# Patient Record
Sex: Female | Born: 1945 | Race: White | Hispanic: No | Marital: Married | Smoking: Former smoker
Health system: Southern US, Community
[De-identification: ages and names within clinical notes are randomized; demographics above are authoritative.]

## PROBLEM LIST (undated history)

## (undated) DIAGNOSIS — K219 Gastro-esophageal reflux disease without esophagitis: Secondary | ICD-10-CM

## (undated) DIAGNOSIS — J189 Pneumonia, unspecified organism: Secondary | ICD-10-CM

## (undated) DIAGNOSIS — L900 Lichen sclerosus et atrophicus: Secondary | ICD-10-CM

## (undated) DIAGNOSIS — F32A Depression, unspecified: Secondary | ICD-10-CM

## (undated) DIAGNOSIS — L409 Psoriasis, unspecified: Secondary | ICD-10-CM

## (undated) DIAGNOSIS — I10 Essential (primary) hypertension: Secondary | ICD-10-CM

## (undated) DIAGNOSIS — D649 Anemia, unspecified: Secondary | ICD-10-CM

## (undated) DIAGNOSIS — J45909 Unspecified asthma, uncomplicated: Secondary | ICD-10-CM

## (undated) DIAGNOSIS — J302 Other seasonal allergic rhinitis: Secondary | ICD-10-CM

## (undated) DIAGNOSIS — M8580 Other specified disorders of bone density and structure, unspecified site: Secondary | ICD-10-CM

## (undated) DIAGNOSIS — F329 Major depressive disorder, single episode, unspecified: Secondary | ICD-10-CM

## (undated) DIAGNOSIS — Z98811 Dental restoration status: Secondary | ICD-10-CM

## (undated) DIAGNOSIS — G43909 Migraine, unspecified, not intractable, without status migrainosus: Secondary | ICD-10-CM

## (undated) DIAGNOSIS — J42 Unspecified chronic bronchitis: Secondary | ICD-10-CM

## (undated) DIAGNOSIS — R2689 Other abnormalities of gait and mobility: Secondary | ICD-10-CM

## (undated) DIAGNOSIS — S82851A Displaced trimalleolar fracture of right lower leg, initial encounter for closed fracture: Secondary | ICD-10-CM

## (undated) DIAGNOSIS — M1990 Unspecified osteoarthritis, unspecified site: Secondary | ICD-10-CM

## (undated) HISTORY — DX: Psoriasis, unspecified: L40.9

## (undated) HISTORY — DX: Other specified disorders of bone density and structure, unspecified site: M85.80

## (undated) HISTORY — DX: Lichen sclerosus et atrophicus: L90.0

## (undated) HISTORY — PX: KNEE SURGERY: SHX244

## (undated) HISTORY — DX: Migraine, unspecified, not intractable, without status migrainosus: G43.909

## (undated) HISTORY — DX: Other abnormalities of gait and mobility: R26.89

## (undated) HISTORY — PX: HAND SURGERY: SHX662

## (undated) HISTORY — PX: TONSILLECTOMY: SUR1361

## (undated) HISTORY — PX: ABDOMINAL HYSTERECTOMY: SHX81

---

## 2000-09-05 ENCOUNTER — Encounter (INDEPENDENT_AMBULATORY_CARE_PROVIDER_SITE_OTHER): Payer: Self-pay | Admitting: Specialist

## 2000-09-05 ENCOUNTER — Ambulatory Visit (HOSPITAL_COMMUNITY): Admission: RE | Admit: 2000-09-05 | Discharge: 2000-09-05 | Payer: Self-pay | Admitting: Gastroenterology

## 2007-12-11 ENCOUNTER — Ambulatory Visit: Payer: Self-pay | Admitting: Gynecology

## 2008-01-19 ENCOUNTER — Ambulatory Visit: Payer: Self-pay | Admitting: Gynecology

## 2008-02-09 ENCOUNTER — Emergency Department (HOSPITAL_COMMUNITY): Admission: EM | Admit: 2008-02-09 | Discharge: 2008-02-10 | Payer: Self-pay | Admitting: Emergency Medicine

## 2008-04-27 ENCOUNTER — Encounter: Admission: RE | Admit: 2008-04-27 | Discharge: 2008-06-07 | Payer: Self-pay | Admitting: Family Medicine

## 2008-04-27 ENCOUNTER — Encounter: Admission: RE | Admit: 2008-04-27 | Discharge: 2008-04-27 | Payer: Self-pay | Admitting: Family Medicine

## 2009-02-09 ENCOUNTER — Ambulatory Visit: Payer: Self-pay | Admitting: Gynecology

## 2009-03-09 ENCOUNTER — Ambulatory Visit: Payer: Self-pay | Admitting: Gynecology

## 2009-11-16 ENCOUNTER — Ambulatory Visit (HOSPITAL_COMMUNITY): Admission: RE | Admit: 2009-11-16 | Discharge: 2009-11-16 | Payer: Self-pay | Admitting: Internal Medicine

## 2010-01-01 ENCOUNTER — Encounter (HOSPITAL_COMMUNITY): Admission: RE | Admit: 2010-01-01 | Discharge: 2010-03-01 | Payer: Self-pay | Admitting: Family Medicine

## 2010-08-17 NOTE — Procedures (Signed)
[facility]  Patient:    Kim Ferrell, Kim Ferrell                           MRN: [id number] Proc. Date: 09/05/00 Adm. Date:  [id number] Attending:  Nelda Marseille CC:         Carola J. Gerri Spore, M.D.   Procedure Report  PROCEDURE:  Colonoscopy with biopsy.  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 Consent was signed after risks, benefits, methods, and options were thoroughly discussed in the office.  MEDICINES USED:  Demerol 50, Versed 5.  DESCRIPTION OF PROCEDURE:  Rectal inspection was pertinent for external hemorrhoids, digital exam was negative. The pediatric video colonoscope was inserted and with mild difficulty due to a tortuous colon, I was able to advance to the cecum. This did require rolling her on her back and some abdominal pressure. No obvious abnormalities seen on insertion. The cecum was identified by the appendiceal orifice and the ileocecal valve. In fact, the scope was inserted a short ways into the terminal ileum which was normal. Photo documentation was obtained. On slow withdrawal through the colon, the prep was adequate. There was some liquid stool that required washing and suctioning but on slow withdrawal through the colon, no abnormalities were seen. When we fell back around the tortuous loop, we did try to readvance around the curves. Once back in the rectum, the scope was retroflexed pertinent for some internal hemorrhoids and possibly some tiny hyperplastic appearing polyps, three of which were cold biopsied but not all. They had no worrisome characteristics. The scope was straightened, air was withdrawn and the scope removed. The patient tolerated the procedure well and there was no obvious or immediate complication.  ENDOSCOPIC DIAGNOSIS: 1. Internal external hemorrhoids. 2. Tortuous colon particularly in the sigmoid. 3. Tiny  rectal hyperplastic appearing polyps on retroflexion status post cold    biopsy of a few of them. 4. Otherwise within normal limits to the terminal ileum.  PLAN:  Happy to see back p.r.n. Will await pathology but probably recheck colon screening in five years. Yearly rectals and guaiacs per Dr. Gerri Spore. DD:  09/05/00 TD:  09/05/00 Job: [id number]

## 2010-12-31 ENCOUNTER — Encounter: Payer: Self-pay | Admitting: *Deleted

## 2010-12-31 ENCOUNTER — Encounter: Payer: Self-pay | Admitting: Gynecology

## 2010-12-31 ENCOUNTER — Ambulatory Visit (INDEPENDENT_AMBULATORY_CARE_PROVIDER_SITE_OTHER): Payer: Medicare Other | Admitting: Gynecology

## 2010-12-31 DIAGNOSIS — N898 Other specified noninflammatory disorders of vagina: Secondary | ICD-10-CM

## 2010-12-31 DIAGNOSIS — B373 Candidiasis of vulva and vagina: Secondary | ICD-10-CM

## 2010-12-31 DIAGNOSIS — L409 Psoriasis, unspecified: Secondary | ICD-10-CM | POA: Insufficient documentation

## 2010-12-31 DIAGNOSIS — N952 Postmenopausal atrophic vaginitis: Secondary | ICD-10-CM

## 2010-12-31 DIAGNOSIS — L940 Localized scleroderma [morphea]: Secondary | ICD-10-CM

## 2010-12-31 DIAGNOSIS — G43909 Migraine, unspecified, not intractable, without status migrainosus: Secondary | ICD-10-CM | POA: Insufficient documentation

## 2010-12-31 DIAGNOSIS — B3731 Acute candidiasis of vulva and vagina: Secondary | ICD-10-CM

## 2010-12-31 DIAGNOSIS — IMO0002 Reserved for concepts with insufficient information to code with codable children: Secondary | ICD-10-CM

## 2010-12-31 DIAGNOSIS — L900 Lichen sclerosus et atrophicus: Secondary | ICD-10-CM | POA: Insufficient documentation

## 2010-12-31 MED ORDER — ESTRADIOL 0.1 MG/GM VA CREA: 2.0000 g | TOPICAL_CREAM | Freq: Every day | VAGINAL | Status: DC

## 2010-12-31 MED ORDER — CLOBETASOL PROP EMOLLIENT BASE 0.05 % EX CREA: TOPICAL_CREAM | CUTANEOUS | Status: DC

## 2010-12-31 MED ORDER — FLUCONAZOLE 150 MG PO TABS: 150.0000 mg | ORAL_TABLET | Freq: Once | ORAL | Status: AC

## 2010-12-31 NOTE — Progress Notes (Signed)
The patient presents complaining of vaginal irritation and will and this. The ligament intercourse due to discomfort. She has a history of lichen sclerosus has not been seen the office for 2 years. Had been treated with Temovate and Estrace vaginal cream but stopped these when she ran out.  Exam External with superficial excoriations around the posterior fourchette consistent with atrophic changes as well as whitefish skin changes symmetrical on the labia bilaterally. Speculum shows vagina atrophic with white discharge KOH wet prep done digital bimanual without masses or tenderness.  Assessment and plan: Lichen sclerosus with significant atrophic changes. We'll start with Temovate 0.05% cream nightly and Estrace 1-2 g vaginally nightly. I again discussed the issues of absorption with increased risk of stroke heart attack DVT which we had previously discussed I again discussed that now she understands and accepts this.  KOH wet prep does show yeast we'll treat with Diflucan 150x1 dose. I asked her to make appointment to see me in a month regardless and then we'll go from there.  She does have an appointment to see an internist for general health maintenance and does her general physical exams to include breast exams and she is in the process of scheduling a mammogram now. I discussed Pap smear recommendations. She is status post hysterectomy has never had an issue with abnormal Pap smears historically I think given this history with her age we can stop doing Pap smears now. Patient agrees with this.

## 2011-01-01 LAB — CBC
HCT: 35.9 — ABNORMAL LOW
MCHC: 34.1

## 2011-01-01 LAB — DIFFERENTIAL
Basophils Absolute: 0
Eosinophils Absolute: 0.2
Eosinophils Relative: 4
Lymphocytes Relative: 41
Neutrophils Relative %: 47

## 2011-01-01 LAB — URINALYSIS, ROUTINE W REFLEX MICROSCOPIC
Glucose, UA: NEGATIVE
Hgb urine dipstick: NEGATIVE
Ketones, ur: NEGATIVE
Nitrite: NEGATIVE
Specific Gravity, Urine: 1.014
Urobilinogen, UA: 0.2
pH: 6

## 2011-01-01 LAB — COMPREHENSIVE METABOLIC PANEL
ALT: 13
AST: 15
BUN: 15
CO2: 31
GFR calc non Af Amer: >60
Sodium: 140
Total Bilirubin: 0.6

## 2011-01-01 LAB — RAPID URINE DRUG SCREEN, HOSP PERFORMED
Benzodiazepines: NOT DETECTED
Cocaine: NOT DETECTED
Opiates: NOT DETECTED
Tetrahydrocannabinol: NOT DETECTED

## 2011-01-01 LAB — URINE MICROSCOPIC-ADD ON

## 2011-01-01 LAB — LIPASE, BLOOD: Lipase: 20

## 2011-01-01 LAB — ETHANOL: Alcohol, Ethyl (B): <5

## 2011-02-04 ENCOUNTER — Encounter: Payer: Self-pay | Admitting: Gynecology

## 2011-02-04 ENCOUNTER — Ambulatory Visit (INDEPENDENT_AMBULATORY_CARE_PROVIDER_SITE_OTHER): Payer: Medicare Other | Admitting: Gynecology

## 2011-02-04 DIAGNOSIS — N952 Postmenopausal atrophic vaginitis: Secondary | ICD-10-CM

## 2011-02-04 DIAGNOSIS — L900 Lichen sclerosus et atrophicus: Secondary | ICD-10-CM

## 2011-02-04 DIAGNOSIS — L940 Localized scleroderma [morphea]: Secondary | ICD-10-CM

## 2011-02-04 NOTE — Progress Notes (Signed)
Patient presents in follow up having been seen in the beginning of October with lichen sclerosis and atrophic vaginal changes. She started on Temovate 0.05% cream nightly and Estrace 1-2 g vaginally nightly. She has not used it for 2 weeks now [place] and did not want to deal with it. Notes significant improvement in her symptoms.  Exam Pelvic external with symmetrical whitish changes consistent with lichen sclerosus and atrophic changes. No cracking or excoriations previously noted.  Assessment and plan: Lichen sclerosus and atrophic genital changes. I recommended to continue both creams for another month and then wean herself off the Temovate. Continue at a baseline twice weekly use of the Estrace cream. I again reviewed with her the risks of absorption of the Estrace and the risk of stroke heart attack DVT as well as possible breast cancer issues. She is status post hysterectomy. Again discussed Vagifem alternative at this point we'll stay with the Estrace cream as she's comfortable with this. She'll see me in 6 months and follow up assuming she continues doing well. She has a flare of her lichen sclerosus and she'll use the Temovate for 2-3 weeks nightly and then wean herself back off again.

## 2011-02-04 NOTE — Patient Instructions (Signed)
Follow up appt in 6 months

## 2011-02-08 ENCOUNTER — Encounter: Payer: Self-pay | Admitting: Gynecology

## 2011-05-24 ENCOUNTER — Ambulatory Visit
Admission: RE | Admit: 2011-05-24 | Discharge: 2011-05-24 | Disposition: A | Discharge status: Home / Self Care | Payer: BC Managed Care – PPO | Source: Ambulatory Visit | Attending: Internal Medicine | Admitting: Internal Medicine

## 2011-05-24 ENCOUNTER — Other Ambulatory Visit: Payer: Self-pay | Admitting: Internal Medicine

## 2011-05-24 DIAGNOSIS — R51 Headache: Secondary | ICD-10-CM

## 2011-05-29 ENCOUNTER — Emergency Department (HOSPITAL_BASED_OUTPATIENT_CLINIC_OR_DEPARTMENT_OTHER)
Admission: EM | Admit: 2011-05-29 | Discharge: 2011-05-29 | Disposition: A | Discharge status: Home / Self Care | Payer: Medicare Other | Attending: Emergency Medicine | Admitting: Emergency Medicine

## 2011-05-29 ENCOUNTER — Encounter (HOSPITAL_BASED_OUTPATIENT_CLINIC_OR_DEPARTMENT_OTHER): Payer: Self-pay | Admitting: *Deleted

## 2011-05-29 ENCOUNTER — Other Ambulatory Visit: Payer: Self-pay

## 2011-05-29 DIAGNOSIS — I10 Essential (primary) hypertension: Secondary | ICD-10-CM | POA: Insufficient documentation

## 2011-05-29 DIAGNOSIS — F3289 Other specified depressive episodes: Secondary | ICD-10-CM | POA: Insufficient documentation

## 2011-05-29 DIAGNOSIS — F329 Major depressive disorder, single episode, unspecified: Secondary | ICD-10-CM | POA: Insufficient documentation

## 2011-05-29 DIAGNOSIS — R6883 Chills (without fever): Secondary | ICD-10-CM | POA: Insufficient documentation

## 2011-05-29 DIAGNOSIS — Z87891 Personal history of nicotine dependence: Secondary | ICD-10-CM | POA: Insufficient documentation

## 2011-05-29 DIAGNOSIS — R1110 Vomiting, unspecified: Secondary | ICD-10-CM | POA: Insufficient documentation

## 2011-05-29 DIAGNOSIS — L940 Localized scleroderma [morphea]: Secondary | ICD-10-CM | POA: Insufficient documentation

## 2011-05-29 DIAGNOSIS — R197 Diarrhea, unspecified: Secondary | ICD-10-CM

## 2011-05-29 DIAGNOSIS — E876 Hypokalemia: Secondary | ICD-10-CM

## 2011-05-29 DIAGNOSIS — K219 Gastro-esophageal reflux disease without esophagitis: Secondary | ICD-10-CM | POA: Insufficient documentation

## 2011-05-29 DIAGNOSIS — L408 Other psoriasis: Secondary | ICD-10-CM | POA: Insufficient documentation

## 2011-05-29 DIAGNOSIS — Z9079 Acquired absence of other genital organ(s): Secondary | ICD-10-CM | POA: Insufficient documentation

## 2011-05-29 DIAGNOSIS — G43909 Migraine, unspecified, not intractable, without status migrainosus: Secondary | ICD-10-CM | POA: Insufficient documentation

## 2011-05-29 HISTORY — DX: Essential (primary) hypertension: I10

## 2011-05-29 HISTORY — DX: Gastro-esophageal reflux disease without esophagitis: K21.9

## 2011-05-29 HISTORY — DX: Depression, unspecified: F32.A

## 2011-05-29 HISTORY — DX: Major depressive disorder, single episode, unspecified: F32.9

## 2011-05-29 LAB — COMPREHENSIVE METABOLIC PANEL
ALT: 13 U/L (ref 0–35)
AST: 17 U/L (ref 0–37)
Albumin: 4.1 g/dL (ref 3.5–5.2)
Calcium: 9.6 mg/dL (ref 8.4–10.5)
Creatinine, Ser: 0.8 mg/dL (ref 0.50–1.10)
GFR calc non Af Amer: 75 mL/min — ABNORMAL LOW (ref >90)
Sodium: 136 mEq/L (ref 135–145)
Total Protein: 7 g/dL (ref 6.0–8.3)

## 2011-05-29 LAB — DIFFERENTIAL
Basophils Absolute: 0 10*3/uL (ref 0.0–0.1)
Basophils Relative: 0 % (ref 0–1)
Eosinophils Absolute: 0.2 10*3/uL (ref 0.0–0.7)
Eosinophils Relative: 2 % (ref 0–5)
Monocytes Absolute: 0.6 10*3/uL (ref 0.1–1.0)

## 2011-05-29 LAB — CBC
HCT: 36.7 % (ref 36.0–46.0)
MCHC: 35.1 g/dL (ref 30.0–36.0)
MCV: 85.7 fL (ref 78.0–100.0)
Platelets: 292 10*3/uL (ref 150–400)
RDW: 13.1 % (ref 11.5–15.5)
WBC: 7.9 10*3/uL (ref 4.0–10.5)

## 2011-05-29 MED ORDER — ONDANSETRON HCL 4 MG/2ML IJ SOLN
INTRAMUSCULAR | Status: AC
  Administered 2011-05-29: 4 mg via INTRAVENOUS
  Filled 2011-05-29: qty 2

## 2011-05-29 MED ORDER — ONDANSETRON HCL 4 MG/2ML IJ SOLN
4.0000 mg | Freq: Once | INTRAMUSCULAR | Status: AC
  Administered 2011-05-29: 4 mg via INTRAVENOUS

## 2011-05-29 MED ORDER — SODIUM CHLORIDE 0.9 % IV BOLUS (SEPSIS)
1000.0000 mL | Freq: Once | INTRAVENOUS | Status: AC
  Administered 2011-05-29: 1000 mL via INTRAVENOUS

## 2011-05-29 MED ORDER — ONDANSETRON HCL 4 MG/2ML IJ SOLN
4.0000 mg | Freq: Once | INTRAMUSCULAR | Status: AC
  Administered 2011-05-29: 4 mg via INTRAVENOUS
  Filled 2011-05-29: qty 2

## 2011-05-29 MED ORDER — ONDANSETRON 8 MG PO TBDP: 8.0000 mg | ORAL_TABLET | Freq: Three times a day (TID) | ORAL | Status: AC | PRN

## 2011-05-29 MED ORDER — POTASSIUM CHLORIDE 20 MEQ/15ML (10%) PO LIQD
10.0000 meq | Freq: Once | ORAL | Status: AC
  Administered 2011-05-29: 10 meq via ORAL
  Filled 2011-05-29: qty 15

## 2011-05-29 NOTE — Discharge Instructions (Signed)
Diarrhea Infections caused by germs (bacterial) or a virus commonly cause diarrhea. Your caregiver has determined that with time, rest and fluids, the diarrhea should improve. In general, eat normally while drinking more water than usual. Although water may prevent dehydration, it does not contain salt and minerals (electrolytes). Broths, weak tea without caffeine and oral rehydration solutions (ORS) replace fluids and electrolytes. Small amounts of fluids should be taken frequently. Large amounts at one time may not be tolerated. Plain water may be harmful in infants and the elderly. Oral rehydrating solutions (ORS) are available at pharmacies and grocery stores. ORS replace water and important electrolytes in proper proportions. Sports drinks are not as effective as ORS and may be harmful due to sugars worsening diarrhea.  ORS is especially recommended for use in children with diarrhea. As a general guideline for children, replace any new fluid losses from diarrhea and/or vomiting with ORS as follows:   If your child weighs 22 pounds or under (10 kg or less), give 60-120 mL ( -  cup or 2 - 4 ounces) of ORS for each episode of diarrheal stool or vomiting episode.   If your child weighs more than 22 pounds (more than 10 kgs), give 120-240 mL ( - 1 cup or 4 - 8 ounces) of ORS for each diarrheal stool or episode of vomiting.   While correcting for dehydration, children should eat normally. However, foods high in sugar should be avoided because this may worsen diarrhea. Large amounts of carbonated soft drinks, juice, gelatin desserts and other highly sugared drinks should be avoided.   After correction of dehydration, other liquids that are appealing to the child may be added. Children should drink small amounts of fluids frequently and fluids should be increased as tolerated. Children should drink enough fluids to keep urine clear or pale yellow.   Adults should eat normally while drinking more fluids  than usual. Drink small amounts of fluids frequently and increase as tolerated. Drink enough fluids to keep urine clear or pale yellow. Broths, weak decaffeinated tea, lemon lime soft drinks (allowed to go flat) and ORS replace fluids and electrolytes.   Avoid:   Carbonated drinks.   Juice.   Extremely hot or cold fluids.   Caffeine drinks.   Fatty, greasy foods.   Alcohol.   Tobacco.   Too much intake of anything at one time.   Gelatin desserts.   Probiotics are active cultures of beneficial bacteria. They may lessen the amount and number of diarrheal stools in adults. Probiotics can be found in yogurt with active cultures and in supplements.   Wash hands well to avoid spreading bacteria and virus.   Anti-diarrheal medications are not recommended for infants and children.   Only take over-the-counter or prescription medicines for pain, discomfort or fever as directed by your caregiver. Do not give aspirin to children because it may cause Reye's Syndrome.   For adults, ask your caregiver if you should continue all prescribed and over-the-counter medicines.   If your caregiver has given you a follow-up appointment, it is very important to keep that appointment. Not keeping the appointment could result in a chronic or permanent injury, and disability. If there is any problem keeping the appointment, you must call back to this facility for assistance.  SEEK IMMEDIATE MEDICAL CARE IF:   You or your child is unable to keep fluids down or other symptoms or problems become worse in spite of treatment.   Vomiting or diarrhea develops and becomes persistent.     There is vomiting of blood or bile (green material).   There is blood in the stool or the stools are black and tarry.   There is no urine output in 6-8 hours or there is only a small amount of very dark urine.   Abdominal pain develops, increases or localizes.   You have a fever.   Your baby is older than 3 months with a  rectal temperature of 102 F (38.9 C) or higher.   Your baby is 3 months old or younger with a rectal temperature of 100.4 F (38 C) or higher.   You or your child develops excessive weakness, dizziness, fainting or extreme thirst.   You or your child develops a rash, stiff neck, severe headache or become irritable or sleepy and difficult to awaken.  MAKE SURE YOU:   Understand these instructions.   Will watch your condition.   Will get help right away if you are not doing well or get worse.  Document Released: 03/08/2002 Document Revised: 11/28/2010 Document Reviewed: 01/23/2009 [employer] Patient Information 2012 [employer]. 

## 2011-05-29 NOTE — ED Notes (Signed)
Pt ambulatory to restroom

## 2011-05-29 NOTE — ED Notes (Signed)
Pt given ice chips. Will reassess shortly

## 2011-05-29 NOTE — ED Provider Notes (Signed)
History     CSN: [id number]  Arrival date & time 05/29/11  2107   First MD Initiated Contact with Patient 05/29/11 2116      Chief Complaint  Patient presents with  . Emesis     Patient is a 66 y.o. female presenting with vomiting. The history is provided by the patient and the spouse.  Emesis  This is a new problem. The current episode started less than 1 hour ago. The problem occurs 5 to 10 times per day. The problem has been gradually worsening. The emesis has an appearance of stomach contents. There has been no fever. Associated symptoms include chills and diarrhea. Pertinent negatives include no abdominal pain. Risk factors include suspect food intake.  patient was at a restaurant when she had abrupt onset of nausea/vomiting/diarrhea No blood noted in stool/vomitus No abd pain No syncope No cp/sob She had otherwise been well prior to dinner No recent travel  Past Medical History  Diagnosis Date  . Lichen sclerosus   . Psoriasis   . Migraines   . Hypertension   . Depression   . GERD (gastroesophageal reflux disease)     Past Surgical History  Procedure Date  . Left hand surgery   . Abdominal hysterectomy     Family History  Problem Relation Age of Onset  . Diabetes Mother   . Heart disease Mother   . Cancer Father     skin    History  Substance Use Topics  . Smoking status: Former Games developer  . Smokeless tobacco: Never Used  . Alcohol Use: 1.0 oz/week    2 drink(s) per week     pt drank wine tonight    OB History    Grav Para Term Preterm Abortions TAB SAB Ect Mult Living   2 2 2       2       Review of Systems  Constitutional: Positive for chills.  Gastrointestinal: Positive for vomiting and diarrhea. Negative for abdominal pain.  All other systems reviewed and are negative.    Allergies  Neosporin  Home Medications   Current Outpatient Rx  Name Route Sig Dispense Refill  . BUPROPION HCL ER (SR) 150 MG PO TB12 Oral Take 150 mg by mouth  daily.    Marland Kitchen BUTALBITAL-APAP-CAFF-COD 50-325-40-30 MG PO CAPS Oral Take 1 capsule by mouth every 4 (four) hours as needed. For headache    . CALCIPOTRIENE 0.005 % EX CREA Topical Apply 1 application topically 2 (two) times daily.    Marland Kitchen FEXOFENADINE HCL 180 MG PO TABS Oral Take 180 mg by mouth daily.    Marland Kitchen HALOBETASOL PROPIONATE 0.05 % EX CREA Topical Apply 1 application topically 2 (two) times daily.    Marland Kitchen LOSARTAN POTASSIUM-HCTZ 100-25 MG PO TABS Oral Take 1 tablet by mouth daily.    Marland Kitchen METHOTREXATE 2.5 MG PO TABS Oral Take 10 mg by mouth once a week. Caution:Chemotherapy. Protect from light.    . OMEPRAZOLE 40 MG PO CPDR Oral Take 40 mg by mouth daily.    Marland Kitchen PAROXETINE HCL 20 MG PO TABS Oral Take 20 mg by mouth every morning.    . SUMATRIPTAN SUCCINATE 100 MG PO TABS Oral Take 100 mg by mouth every 2 (two) hours as needed. For headache      BP 142/82  Pulse 77  Temp(Src) 97.5 F (36.4 C) (Oral)  Resp 18  Ht 5\' 5"  (1.651 m)  Wt 153 lb (69.4 kg)  BMI 25.46 kg/m2  SpO2  99%  LMP 04/02/1987  Physical Exam CONSTITUTIONAL: Well developed/well nourished HEAD AND FACE: Normocephalic/atraumatic EYES: EOMI/PERRL, no scleral icterus ENMT: Mucous membranes dry NECK: supple no meningeal signs SPINE:entire spine nontender CV: S1/S2 noted, no murmurs/rubs/gallops noted LUNGS: Lungs are clear to auscultation bilaterally, no apparent distress ABDOMEN: soft, nontender, no rebound or guarding GU:no cva tenderness NEURO: Pt is awake/alert, moves all extremitiesx4 EXTREMITIES: pulses normal, full ROM SKIN: warm, color normal PSYCH: no abnormalities of mood noted  ED Course  Procedures   Labs Reviewed  COMPREHENSIVE METABOLIC PANEL - Abnormal; Notable for the following:    Potassium 2.9 (*)    Chloride 95 (*)    Glucose, Bld 118 (*)    GFR calc non Af Amer 75 (*)    GFR calc Af Amer 87 (*)    All other components within normal limits  CBC  DIFFERENTIAL  LIPASE, BLOOD   10:12 PM Pt with  abrupt onset of vomiting/diarrhea now improved Will rehydrate and reassess  11:33 PM Pt improved Taking PO BP 118/70  Pulse 79  Temp(Src) 97.5 F (36.4 C) (Oral)  Resp 16  Ht 5\' 5"  (1.651 m)  Wt 153 lb (69.4 kg)  BMI 25.46 kg/m2  SpO2 95%  LMP 04/02/1987 Stable for d/c Suspect gastroenteritis   MDM  Nursing notes reviewed and considered in documentation All labs/vitals reviewed and considered    Date: 05/29/2011  Rate: 74  Rhythm: normal sinus rhythm  QRS Axis: normal  Intervals: normal  ST/T Wave abnormalities: normal  Conduction Disutrbances:none  Narrative Interpretation:   Old EKG Reviewed: unchanged          Joya Gaskins, MD 05/29/11 2333

## 2011-05-29 NOTE — ED Notes (Signed)
No vomiting noted after eating ice chips or drinking Potassium. Pt denies nausea and states she feels ready for discharge

## 2011-05-29 NOTE — ED Notes (Signed)
Pt reports nausea is returning. No active vomiting at this time.

## 2011-05-29 NOTE — ED Notes (Signed)
No vomiting noted since meds administered. Warm blankets applied. IVF continue to infuse. Husband left to pick up clean clothes from home.

## 2011-05-29 NOTE — ED Notes (Signed)
Per EMS: pt was eating dinner tonight, when she had a sudden onset of N/V/D. Pt has vomited multiple times and stooled on self. Pt actively vomiting upon arrival to ER.

## 2011-09-16 ENCOUNTER — Emergency Department (HOSPITAL_BASED_OUTPATIENT_CLINIC_OR_DEPARTMENT_OTHER)
Admission: EM | Admit: 2011-09-16 | Discharge: 2011-09-16 | Disposition: A | Discharge status: Home / Self Care | Payer: Medicare Other | Attending: Emergency Medicine | Admitting: Emergency Medicine

## 2011-09-16 ENCOUNTER — Encounter (HOSPITAL_BASED_OUTPATIENT_CLINIC_OR_DEPARTMENT_OTHER): Payer: Self-pay | Admitting: *Deleted

## 2011-09-16 ENCOUNTER — Encounter (HOSPITAL_BASED_OUTPATIENT_CLINIC_OR_DEPARTMENT_OTHER): Payer: Self-pay | Admitting: Family Medicine

## 2011-09-16 ENCOUNTER — Emergency Department (HOSPITAL_BASED_OUTPATIENT_CLINIC_OR_DEPARTMENT_OTHER): Payer: Medicare Other

## 2011-09-16 DIAGNOSIS — W108XXA Fall (on) (from) other stairs and steps, initial encounter: Secondary | ICD-10-CM | POA: Insufficient documentation

## 2011-09-16 DIAGNOSIS — S82851A Displaced trimalleolar fracture of right lower leg, initial encounter for closed fracture: Secondary | ICD-10-CM

## 2011-09-16 DIAGNOSIS — F3289 Other specified depressive episodes: Secondary | ICD-10-CM | POA: Insufficient documentation

## 2011-09-16 DIAGNOSIS — K219 Gastro-esophageal reflux disease without esophagitis: Secondary | ICD-10-CM | POA: Insufficient documentation

## 2011-09-16 DIAGNOSIS — S82853A Displaced trimalleolar fracture of unspecified lower leg, initial encounter for closed fracture: Secondary | ICD-10-CM | POA: Insufficient documentation

## 2011-09-16 DIAGNOSIS — I10 Essential (primary) hypertension: Secondary | ICD-10-CM | POA: Insufficient documentation

## 2011-09-16 DIAGNOSIS — Y92009 Unspecified place in unspecified non-institutional (private) residence as the place of occurrence of the external cause: Secondary | ICD-10-CM | POA: Insufficient documentation

## 2011-09-16 DIAGNOSIS — M542 Cervicalgia: Secondary | ICD-10-CM | POA: Insufficient documentation

## 2011-09-16 DIAGNOSIS — Z79899 Other long term (current) drug therapy: Secondary | ICD-10-CM | POA: Insufficient documentation

## 2011-09-16 DIAGNOSIS — F329 Major depressive disorder, single episode, unspecified: Secondary | ICD-10-CM | POA: Insufficient documentation

## 2011-09-16 HISTORY — DX: Displaced trimalleolar fracture of right lower leg, initial encounter for closed fracture: S82.851A

## 2011-09-16 LAB — CBC
HCT: 36.3 % (ref 36.0–46.0)
Hemoglobin: 12.7 g/dL (ref 12.0–15.0)
MCV: 88.8 fL (ref 78.0–100.0)
Platelets: 269 10*3/uL (ref 150–400)
RBC: 4.09 MIL/uL (ref 3.87–5.11)
WBC: 6 10*3/uL (ref 4.0–10.5)

## 2011-09-16 LAB — DIFFERENTIAL
Eosinophils Relative: 3 % (ref 0–5)
Lymphocytes Relative: 24 % (ref 12–46)
Lymphs Abs: 1.4 10*3/uL (ref 0.7–4.0)
Monocytes Relative: 8 % (ref 3–12)

## 2011-09-16 LAB — BASIC METABOLIC PANEL
BUN: 18 mg/dL (ref 6–23)
CO2: 29 mEq/L (ref 19–32)
Calcium: 9.3 mg/dL (ref 8.4–10.5)
Glucose, Bld: 121 mg/dL — ABNORMAL HIGH (ref 70–99)
Sodium: 138 mEq/L (ref 135–145)

## 2011-09-16 MED ORDER — ONDANSETRON 8 MG PO TBDP
ORAL_TABLET | ORAL | Status: AC
  Administered 2011-09-16: 8 mg
  Filled 2011-09-16: qty 1

## 2011-09-16 MED ORDER — ONDANSETRON HCL 4 MG PO TABS: 4.0000 mg | ORAL_TABLET | Freq: Three times a day (TID) | ORAL | Status: AC | PRN

## 2011-09-16 MED ORDER — ONDANSETRON HCL 4 MG/2ML IJ SOLN
4.0000 mg | Freq: Once | INTRAMUSCULAR | Status: AC
  Administered 2011-09-16: 4 mg via INTRAVENOUS
  Filled 2011-09-16: qty 2

## 2011-09-16 MED ORDER — MORPHINE SULFATE 4 MG/ML IJ SOLN
4.0000 mg | Freq: Once | INTRAMUSCULAR | Status: AC
  Administered 2011-09-16: 4 mg via INTRAVENOUS
  Filled 2011-09-16: qty 1

## 2011-09-16 MED ORDER — ONDANSETRON HCL 4 MG/2ML IJ SOLN
4.0000 mg | Freq: Once | INTRAMUSCULAR | Status: DC
  Filled 2011-09-16: qty 2

## 2011-09-16 MED ORDER — HYDROCODONE-ACETAMINOPHEN 5-325 MG PO TABS: 2.0000 | ORAL_TABLET | ORAL | Status: DC | PRN

## 2011-09-16 MED ORDER — HYDROMORPHONE HCL PF 1 MG/ML IJ SOLN
1.0000 mg | Freq: Once | INTRAMUSCULAR | Status: AC
  Administered 2011-09-16: 1 mg via INTRAVENOUS
  Filled 2011-09-16: qty 1

## 2011-09-16 MED ORDER — HYDROCODONE-ACETAMINOPHEN 5-325 MG PO TABS
2.0000 | ORAL_TABLET | Freq: Once | ORAL | Status: AC
  Administered 2011-09-16: 2 via ORAL
  Filled 2011-09-16: qty 2

## 2011-09-16 NOTE — ED Provider Notes (Signed)
History     CSN: [id number]  Arrival date & time 09/16/11  6213   First MD Initiated Contact with Patient 09/16/11 0914      Chief Complaint  Patient presents with  . Ankle Pain    (Consider location/radiation/quality/duration/timing/severity/associated sxs/prior treatment) HPI Patient is a 66 year old female who sustained a mechanical fall this morning after slipping on her back stairs on wet concrete. Patient fell about 3 feet. She did not strike her head or neck but did describe some neck tenderness with this. Patient's chief complaint is severe right ankle pain that she rates as a 10 out of 10. Patient has obvious right ankle deformity with a slight abrasion overlying this. Patient denies any chest pain, shortness of breath, or abdominal pain. She reports radiation of her pain up her leg. She has very mild tenderness on palpation of the proximal tibia and fibula. Patient complains of some right thigh pain but the right thigh is soft and without deformity. Patient has history of osteopenia from frequent steroid use for her psoriasis. Pain is worse with movement or palpation. There are no other associated or modifying factors. Past Medical History  Diagnosis Date  . Lichen sclerosus   . Psoriasis   . Migraines   . Hypertension   . Depression   . GERD (gastroesophageal reflux disease)     Past Surgical History  Procedure Date  . Left hand surgery   . Abdominal hysterectomy     Family History  Problem Relation Age of Onset  . Diabetes Mother   . Heart disease Mother   . Cancer Father     skin    History  Substance Use Topics  . Smoking status: Former Games developer  . Smokeless tobacco: Never Used  . Alcohol Use: 1.0 oz/week    2 drink(s) per week     pt drank wine tonight    OB History    Grav Para Term Preterm Abortions TAB SAB Ect Mult Living   2 2 2       2       Review of Systems  Constitutional: Negative.   HENT: Positive for neck pain.   Eyes: Negative.     Respiratory: Negative.   Cardiovascular: Negative.   Gastrointestinal: Negative.   Genitourinary: Negative.   Musculoskeletal:       Slight neck stiffness as well severe right ankle pain as described in history of present illness  Skin: Positive for wound.       Small laceration overlying the ankle.  Neurological: Negative.   Hematological: Negative.   Psychiatric/Behavioral: Negative.   All other systems reviewed and are negative.    Allergies  Neosporin  Home Medications   Current Outpatient Rx  Name Route Sig Dispense Refill  . BUPROPION HCL ER (SR) 150 MG PO TB12 Oral Take 150 mg by mouth daily.    Marland Kitchen BUTALBITAL-APAP-CAFF-COD 50-325-40-30 MG PO CAPS Oral Take 1 capsule by mouth every 4 (four) hours as needed. For headache    . CALCIPOTRIENE 0.005 % EX CREA Topical Apply 1 application topically 2 (two) times daily.    Marland Kitchen FEXOFENADINE HCL 180 MG PO TABS Oral Take 180 mg by mouth daily.    Marland Kitchen HALOBETASOL PROPIONATE 0.05 % EX CREA Topical Apply 1 application topically 2 (two) times daily.    Marland Kitchen LOSARTAN POTASSIUM-HCTZ 100-25 MG PO TABS Oral Take 1 tablet by mouth daily.    Marland Kitchen METHOTREXATE 2.5 MG PO TABS Oral Take 10 mg by mouth once a  week. Caution:Chemotherapy. Protect from light.    . OMEPRAZOLE 40 MG PO CPDR Oral Take 40 mg by mouth daily.    Marland Kitchen PAROXETINE HCL 20 MG PO TABS Oral Take 20 mg by mouth every morning.    . SUMATRIPTAN SUCCINATE 100 MG PO TABS Oral Take 100 mg by mouth every 2 (two) hours as needed. For headache      BP 123/75  Pulse 81  Temp 97.8 F (36.6 C) (Oral)  Resp 20  Ht 5\' 5"  (1.651 m)  Wt 154 lb (69.854 kg)  BMI 25.63 kg/m2  SpO2 96%  LMP 04/02/1987  Physical Exam  Nursing note and vitals reviewed. GEN: Well-developed, well-nourished female in mild distress HEENT: Atraumatic, normocephalic. Oropharynx clear without erythema EYES: PERRLA BL, no scleral icterus. NECK: Trachea midline, no meningismus. Mild tenderness to palpation over the trapezius  muscles bilaterally. No midline tenderness or step-off appreciated. CV: regular rate and rhythm. No murmurs, rubs, or gallops PULM: No respiratory distress.  No crackles, wheezes, or rales. GI: soft, non-tender. No guarding, rebound, or tenderness. + bowel sounds  GU: deferred Neuro: cranial nerves grossly 2-12 intact, no abnormalities of strength or sensation, A and O x 3 MSK: Right ankle with significant deformity. Patient with protrusion of the right medial malleolus. Skin: Small laceration overlying the right medial malleolus that is not bleeding and is only 1 cm in length. This does not appear to be an open fracture. Psych: no abnormality of mood   ED Course  Procedures (including critical care time)  Labs Reviewed  BASIC METABOLIC PANEL - Abnormal; Notable for the following:    Glucose, Bld 121 (*)     GFR calc non Af Amer 75 (*)     GFR calc Af Amer 87 (*)     All other components within normal limits  CBC  DIFFERENTIAL   Dg Chest 2 View  09/16/2011  *RADIOLOGY REPORT*  Clinical Data: Fall with right-sided pain.  CHEST - 2 VIEW  Comparison: 02/10/2008  Findings: Upper limits normal heart size noted. Interstitial prominence is again identified. There is no evidence of focal airspace disease, pulmonary edema, suspicious pulmonary nodule/mass, pleural effusion, or pneumothorax. No acute bony abnormalities are identified.  IMPRESSION: No evidence of acute cardiopulmonary disease.  Mild chronic interstitial prominence and upper limits normal heart size.  Original Report Authenticated By: Rosendo Gros, M.D.   Dg Cervical Spine Complete  09/16/2011  *RADIOLOGY REPORT*  Clinical Data: 66 year old female with neck pain following fall.  CERVICAL SPINE - COMPLETE 4+ VIEW  Comparison: 04/27/2008  Findings: Normal alignment is noted. There is no evidence of fracture, subluxation or prevertebral soft tissue swelling. Mild degenerative disc disease/spondylosis is noted, greatest at C5- C7. No  focal bony lesions are identified.  IMPRESSION: No static evidence of acute injury to the cervical spine.  Mild multilevel degenerative changes.  Original Report Authenticated By: Rosendo Gros, M.D.   Dg Tibia/fibula Right  09/16/2011  *RADIOLOGY REPORT*  Clinical Data: Slipped and fell on wet steps injuring right ankle, deformity and bleeding  RIGHT TIBIA AND FIBULA - 2 VIEW  Comparison: None  Findings: Question mild osseous demineralization. Knee joint spaces preserved. Ankle reported separately. No additional tibial or fibular fracture identified. Regional soft tissues unremarkable.  IMPRESSION: Trimalleolar fractures of the right ankle as reported separately on right ankle radiographs. No additional right tibial or fibular abnormality identified.  Original Report Authenticated By: Lollie Marrow, M.D.   Dg Ankle Complete Right  09/16/2011  *  RADIOLOGY REPORT*  Clinical Data: Slipped and fell on wet steps injuring right ankle, deformity, medial bleeding  RIGHT ANKLE - COMPLETE 3+ VIEW  Comparison: None  Findings: Question mild osseous demineralization. Oblique lateral malleolar fracture displaced laterally and posteriorly. Distracted transverse medial malleolar fracture. Tiny nondisplaced posterior medial malleolar fracture. Minimal widening at medial joint line. Overlying soft tissue swelling.  IMPRESSION: Trimalleolar fractures of the right ankle as above.  Original Report Authenticated By: Lollie Marrow, M.D.     1. Trimalleolar fracture of right ankle       MDM  Patient was evaluated by myself after sustaining a mechanical fall when slipping on water today. She had obvious right ankle deformity. Patient also complained of some right neck stiffness. CBC and basic metabolic panel were performed due to my concern over patient being an operative case. Next Will was performed given her neck stiffness I have low suspicion for fracture and this was negative. Plain film of the right ankle showed trimal  fracture and a chest x-ray was performed given that patient will require operative repair.  Chest x-ray was unremarkable. I discussed the patient with Dr. Ave Filter of [facility] to the patient has seen previously. He reviewed the patient's films. These have been reviewed extemporaneously by myself. He requested that patient be discharged home and that she will likely be repaired operatively tomorrow. Patient had posterior splint with stirrup placed under my supervision. This was reviewed after placement and patient was neurovascularly intact. Pain was controlled here with morphine and Dilaudid. Patient was discharged home with prescriptions for Vicodin and Zofran. She is to be n.p.o. after midnight tonight. She was advised to elevate her leg and use ice as much as possible. Patient was discharged with crutches as well. Patient will be contacted by the orthopedics office for surgical appointment tomorrow.        Cyndra Numbers, MD 09/16/11 [phone]

## 2011-09-16 NOTE — ED Notes (Signed)
Pt sts she slipped and fell on wet steps injuring right ankle, obvious deformity, bleeding noted to medial ankle. Cms intact. Ice applied in triage.

## 2011-09-16 NOTE — ED Notes (Signed)
MD at bedside. 

## 2011-09-16 NOTE — ED Notes (Signed)
Returned from xray

## 2011-09-16 NOTE — Discharge Instructions (Signed)
No or drinking after midnight tonight as he will likely have surgery tomorrow. Please keep your ankle as elevated as possible. Use ice frequently as well. Ankle Fracture A fracture is a break in the bone. A cast or splint is used to protect and keep your injured bone from moving.  HOME CARE INSTRUCTIONS   Use your crutches as directed.   To lessen the swelling, keep the injured leg elevated while sitting or lying down.   Apply ice to the injury for 15 to 20 minutes, 3 to 4 times per day while awake for 2 days. Put the ice in a plastic bag and place a thin towel between the bag of ice and your cast.   If you have a plaster or fiberglass cast:   Do not try to scratch the skin under the cast using sharp or pointed objects.   Check the skin around the cast every day. You may put lotion on any red or sore areas.   Keep your cast dry and clean.   If you have a plaster splint:   Wear the splint as directed.   You may loosen the elastic around the splint if your toes become numb, tingle, or turn cold or blue.   Do not put pressure on any part of your cast or splint; it may break. Rest your cast only on a pillow the first 24 hours until it is fully hardened.   Your cast or splint can be protected during bathing with a plastic bag. Do not lower the cast or splint into water.   Take medications as directed by your caregiver. Only take over-the-counter or prescription medicines for pain, discomfort, or fever as directed by your caregiver.   Do not drive a vehicle until your caregiver specifically tells you it is safe to do so.   If your caregiver has given you a follow-up appointment, it is very important to keep that appointment. Not keeping the appointment could result in a chronic or permanent injury, pain, and disability. If there is any problem keeping the appointment, you must call back to this facility for assistance.  SEEK IMMEDIATE MEDICAL CARE IF:   Your cast gets damaged or breaks.    You have continued severe pain or more swelling than you did before the cast was put on.   Your skin or toenails below the injury turn blue or gray, or feel cold or numb.   There is a bad smell or new stains and/or purulent (pus like) drainage coming from under the cast.  If you do not have a window in your cast for observing the wound, a discharge or minor bleeding may show up as a stain on the outside of your cast. Report these findings to your caregiver. MAKE SURE YOU:   Understand these instructions.   Will watch your condition.   Will get help right away if you are not doing well or get worse.  Document Released: 03/15/2000 Document Revised: 03/07/2011 Document Reviewed: 10/20/2007 [facility] Patient Information 2012 [place], [place].Cast or Splint Care Casts and splints support injured limbs and keep bones from moving while they heal.  HOME CARE  Keep the cast or splint uncovered during the drying period.   A plaster cast can take 24 to 48 hours to dry.   A fiberglass cast will dry in less than 1 hour.   Do not rest the cast on anything harder than a pillow for 24 hours.   Do not put weight on your  injured limb. Do not put pressure on the cast. Wait for your doctor's approval.   Keep the cast or splint dry.   Cover the cast or splint with a plastic bag during baths or wet weather.   If you have a cast over your chest and belly (trunk), take sponge baths until the cast is taken off.   Keep your cast or splint clean. Wash a dirty cast with a damp cloth.   Do not put any objects under your cast or splint. Do not scratch the skin under the cast with an object.   Do not take out the padding from inside your cast.   Exercise your joints near the cast as told by your doctor.   Raise (elevate) your injured limb on 1 or 2 pillows for the first 1 to 3 days.  GET HELP RIGHT AWAY IF:  Your cast or splint cracks.   Your cast or splint is too tight or too loose.   You itch  badly under the cast.   Your cast gets wet or has a soft spot.   You have a bad smell coming from the cast.   You get an object stuck under the cast.   Your skin around the cast becomes red or raw.   You have new or more pain after the cast is put on.   You have fluid leaking through the cast.   You cannot move your fingers or toes.   Your fingers or toes turn colors or are cool, painful, or puffy (swollen).   You have tingling or lose feeling (numbness) around the injured area.   You have pain or pressure under the cast.   You have trouble breathing or have shortness of breath.   You have chest pain.  MAKE SURE YOU:  Understand these instructions.   Will watch your condition.   Will get help right away if you are not doing well or get worse.  Document Released: 07/18/2010 Document Revised: 03/07/2011 Document Reviewed: 07/18/2010 [facility] Patient Information 2012 [place], [place].

## 2011-09-16 NOTE — ED Notes (Signed)
Patient transported to X-ray 

## 2011-09-17 ENCOUNTER — Ambulatory Visit (HOSPITAL_BASED_OUTPATIENT_CLINIC_OR_DEPARTMENT_OTHER)
Admission: RE | Admit: 2011-09-17 | Discharge: 2011-09-18 | Disposition: A | Discharge status: Home / Self Care | Payer: Medicare Other | Source: Ambulatory Visit | Attending: Orthopedic Surgery | Admitting: Orthopedic Surgery

## 2011-09-17 ENCOUNTER — Ambulatory Visit (HOSPITAL_BASED_OUTPATIENT_CLINIC_OR_DEPARTMENT_OTHER): Admission: RE | Admit: 2011-09-17 | Payer: Medicare Other | Source: Ambulatory Visit | Admitting: Orthopedic Surgery

## 2011-09-17 ENCOUNTER — Encounter (HOSPITAL_BASED_OUTPATIENT_CLINIC_OR_DEPARTMENT_OTHER): Payer: Self-pay | Admitting: Anesthesiology

## 2011-09-17 ENCOUNTER — Ambulatory Visit (HOSPITAL_BASED_OUTPATIENT_CLINIC_OR_DEPARTMENT_OTHER): Payer: Medicare Other | Admitting: Anesthesiology

## 2011-09-17 ENCOUNTER — Encounter (HOSPITAL_BASED_OUTPATIENT_CLINIC_OR_DEPARTMENT_OTHER)
Admission: RE | Disposition: A | Discharge status: Home / Self Care | Payer: Self-pay | Source: Ambulatory Visit | Attending: Orthopedic Surgery

## 2011-09-17 ENCOUNTER — Encounter (HOSPITAL_BASED_OUTPATIENT_CLINIC_OR_DEPARTMENT_OTHER): Payer: Self-pay

## 2011-09-17 DIAGNOSIS — Z87891 Personal history of nicotine dependence: Secondary | ICD-10-CM | POA: Insufficient documentation

## 2011-09-17 DIAGNOSIS — J45909 Unspecified asthma, uncomplicated: Secondary | ICD-10-CM | POA: Insufficient documentation

## 2011-09-17 DIAGNOSIS — W19XXXA Unspecified fall, initial encounter: Secondary | ICD-10-CM | POA: Insufficient documentation

## 2011-09-17 DIAGNOSIS — I10 Essential (primary) hypertension: Secondary | ICD-10-CM | POA: Insufficient documentation

## 2011-09-17 DIAGNOSIS — S82843A Displaced bimalleolar fracture of unspecified lower leg, initial encounter for closed fracture: Secondary | ICD-10-CM

## 2011-09-17 DIAGNOSIS — K219 Gastro-esophageal reflux disease without esophagitis: Secondary | ICD-10-CM | POA: Insufficient documentation

## 2011-09-17 HISTORY — DX: Unspecified asthma, uncomplicated: J45.909

## 2011-09-17 HISTORY — DX: Other seasonal allergic rhinitis: J30.2

## 2011-09-17 HISTORY — DX: Unspecified osteoarthritis, unspecified site: M19.90

## 2011-09-17 HISTORY — DX: Unspecified chronic bronchitis: J42

## 2011-09-17 HISTORY — DX: Dental restoration status: Z98.811

## 2011-09-17 HISTORY — DX: Displaced trimalleolar fracture of right lower leg, initial encounter for closed fracture: S82.851A

## 2011-09-17 HISTORY — PX: ORIF ANKLE FRACTURE: SHX5408

## 2011-09-17 LAB — POCT I-STAT, CHEM 8
Calcium, Ion: 1.1 mmol/L — ABNORMAL LOW (ref 1.12–1.32)
Chloride: 96 mEq/L (ref 96–112)
Creatinine, Ser: 0.9 mg/dL (ref 0.50–1.10)
Glucose, Bld: 113 mg/dL — ABNORMAL HIGH (ref 70–99)
Potassium: 3 mEq/L — ABNORMAL LOW (ref 3.5–5.1)

## 2011-09-17 SURGERY — OPEN REDUCTION INTERNAL FIXATION (ORIF) ANKLE FRACTURE
Anesthesia: General | Site: Ankle | Laterality: Right | Wound class: Clean

## 2011-09-17 MED ORDER — OXYCODONE-ACETAMINOPHEN 5-325 MG PO TABS: 1.0000 | ORAL_TABLET | ORAL | Status: DC | PRN

## 2011-09-17 MED ORDER — ACETAMINOPHEN 325 MG PO TABS
650.0000 mg | ORAL_TABLET | Freq: Once | ORAL | Status: AC
  Administered 2011-09-17: 650 mg via ORAL

## 2011-09-17 MED ORDER — FENTANYL CITRATE 0.05 MG/ML IJ SOLN
50.0000 ug | INTRAMUSCULAR | Status: DC | PRN
Start: 2011-09-17 — End: 2011-09-17

## 2011-09-17 MED ORDER — MEPERIDINE HCL 25 MG/ML IJ SOLN: 6.2500 mg | INTRAMUSCULAR | Status: DC | PRN

## 2011-09-17 MED ORDER — HYDROMORPHONE HCL PF 1 MG/ML IJ SOLN
1.0000 mg | INTRAMUSCULAR | Status: DC | PRN
  Administered 2011-09-17: 1 mg via INTRAVENOUS

## 2011-09-17 MED ORDER — HYDROCODONE-ACETAMINOPHEN 5-325 MG PO TABS
1.0000 | ORAL_TABLET | ORAL | Status: DC | PRN
  Administered 2011-09-18 (×2): 2 via ORAL

## 2011-09-17 MED ORDER — LACTATED RINGERS IV SOLN
INTRAVENOUS | Status: DC
  Administered 2011-09-17 (×2): via INTRAVENOUS

## 2011-09-17 MED ORDER — CEFAZOLIN SODIUM 1-5 GM-% IV SOLN
INTRAVENOUS | Status: DC | PRN
  Administered 2011-09-17: 1 g via INTRAVENOUS

## 2011-09-17 MED ORDER — MIDAZOLAM HCL 5 MG/5ML IJ SOLN
INTRAMUSCULAR | Status: DC | PRN
  Administered 2011-09-17: 1 mg via INTRAVENOUS

## 2011-09-17 MED ORDER — FENTANYL CITRATE 0.05 MG/ML IJ SOLN
INTRAMUSCULAR | Status: DC | PRN
  Administered 2011-09-17: 100 ug via INTRAVENOUS

## 2011-09-17 MED ORDER — MIDAZOLAM HCL 2 MG/2ML IJ SOLN: 1.0000 mg | INTRAMUSCULAR | Status: DC | PRN

## 2011-09-17 MED ORDER — ALBUTEROL SULFATE (5 MG/ML) 0.5% IN NEBU: 2.5000 mg | INHALATION_SOLUTION | Freq: Once | RESPIRATORY_TRACT | Status: DC

## 2011-09-17 MED ORDER — SUCCINYLCHOLINE CHLORIDE 20 MG/ML IJ SOLN
INTRAMUSCULAR | Status: DC | PRN
  Administered 2011-09-17: 100 mg via INTRAVENOUS

## 2011-09-17 MED ORDER — LIDOCAINE HCL (CARDIAC) 20 MG/ML IV SOLN
INTRAVENOUS | Status: DC | PRN
  Administered 2011-09-17: 50 mg via INTRAVENOUS

## 2011-09-17 MED ORDER — PROPOFOL 10 MG/ML IV EMUL
INTRAVENOUS | Status: DC | PRN
  Administered 2011-09-17: 150 mg via INTRAVENOUS

## 2011-09-17 MED ORDER — EPHEDRINE SULFATE 50 MG/ML IJ SOLN
INTRAMUSCULAR | Status: DC | PRN
  Administered 2011-09-17: 10 mg via INTRAVENOUS

## 2011-09-17 MED ORDER — 0.9 % SODIUM CHLORIDE (POUR BTL) OPTIME
TOPICAL | Status: DC | PRN
  Administered 2011-09-17: 200 mL

## 2011-09-17 MED ORDER — CEFAZOLIN SODIUM 1-5 GM-% IV SOLN
1.0000 g | Freq: Three times a day (TID) | INTRAVENOUS | Status: DC
  Administered 2011-09-17 – 2011-09-18 (×2): 1 g via INTRAVENOUS

## 2011-09-17 MED ORDER — MIDAZOLAM HCL 2 MG/2ML IJ SOLN
1.0000 mg | INTRAMUSCULAR | Status: DC | PRN
  Administered 2011-09-17: 2 mg via INTRAVENOUS

## 2011-09-17 MED ORDER — HYDROMORPHONE HCL PF 1 MG/ML IJ SOLN
0.2500 mg | INTRAMUSCULAR | Status: DC | PRN
  Administered 2011-09-17: 0.25 mg via INTRAVENOUS

## 2011-09-17 MED ORDER — OXYCODONE-ACETAMINOPHEN 5-325 MG PO TABS: 1.0000 | ORAL_TABLET | ORAL | Status: AC | PRN

## 2011-09-17 MED ORDER — SODIUM CHLORIDE 0.9 % IV SOLN
INTRAVENOUS | Status: DC
  Administered 2011-09-17: 20:00:00 via INTRAVENOUS

## 2011-09-17 MED ORDER — ONDANSETRON HCL 4 MG/2ML IJ SOLN
INTRAMUSCULAR | Status: DC | PRN
  Administered 2011-09-17: 4 mg via INTRAVENOUS

## 2011-09-17 MED ORDER — FENTANYL CITRATE 0.05 MG/ML IJ SOLN
50.0000 ug | INTRAMUSCULAR | Status: DC | PRN
  Administered 2011-09-17: 100 ug via INTRAVENOUS

## 2011-09-17 MED ORDER — BUPIVACAINE-EPINEPHRINE PF 0.5-1:200000 % IJ SOLN
INTRAMUSCULAR | Status: DC | PRN
  Administered 2011-09-17: 30 mL
  Administered 2011-09-17: 10 mL

## 2011-09-17 MED ORDER — DEXAMETHASONE SODIUM PHOSPHATE 4 MG/ML IJ SOLN
INTRAMUSCULAR | Status: DC | PRN
  Administered 2011-09-17: 10 mg via INTRAVENOUS

## 2011-09-17 MED ORDER — PROMETHAZINE HCL 25 MG/ML IJ SOLN: 6.2500 mg | INTRAMUSCULAR | Status: DC | PRN

## 2011-09-17 MED ORDER — MIDAZOLAM HCL 2 MG/2ML IJ SOLN: 0.5000 mg | Freq: Once | INTRAMUSCULAR | Status: AC | PRN

## 2011-09-17 MED ORDER — KETOROLAC TROMETHAMINE 15 MG/ML IJ SOLN
15.0000 mg | Freq: Four times a day (QID) | INTRAMUSCULAR | Status: DC | PRN
  Administered 2011-09-18: 15 mg via INTRAVENOUS

## 2011-09-17 SURGICAL SUPPLY — 81 items
APL SKNCLS STERI-STRIP NONHPOA (GAUZE/BANDAGES/DRESSINGS)
BANDAGE ELASTIC 4 VELCRO ST LF (GAUZE/BANDAGES/DRESSINGS) ×2 IMPLANT
BANDAGE ELASTIC 6 VELCRO ST LF (GAUZE/BANDAGES/DRESSINGS) ×1 IMPLANT
BANDAGE ESMARK 6X9 LF (GAUZE/BANDAGES/DRESSINGS) IMPLANT
BENZOIN TINCTURE PRP APPL 2/3 (GAUZE/BANDAGES/DRESSINGS) IMPLANT
BIT DRILL CANNULATED 2.7X160 (BIT) ×1 IMPLANT
BIT DRILL QC 3.5X110 (BIT) ×1 IMPLANT
BIT DRILL SOLID 2.5X110 (BIT) ×1 IMPLANT
BLADE SURG 15 STRL LF DISP TIS (BLADE) ×1 IMPLANT
BLADE SURG 15 STRL SS (BLADE) ×4
BNDG CMPR 9X4 STRL LF SNTH (GAUZE/BANDAGES/DRESSINGS) ×1
BNDG CMPR 9X6 STRL LF SNTH (GAUZE/BANDAGES/DRESSINGS) ×1
BNDG ESMARK 4X9 LF (GAUZE/BANDAGES/DRESSINGS) ×2 IMPLANT
BNDG ESMARK 6X9 LF (GAUZE/BANDAGES/DRESSINGS) ×2
CANISTER SUCTION 1200CC (MISCELLANEOUS) ×1 IMPLANT
CHLORAPREP W/TINT 26ML (MISCELLANEOUS) ×2 IMPLANT
CLOTH BEACON ORANGE TIMEOUT ST (SAFETY) ×2 IMPLANT
COVER TABLE BACK 60X90 (DRAPES) ×2 IMPLANT
CUFF TOURNIQUET SINGLE 34IN LL (TOURNIQUET CUFF) ×1 IMPLANT
DECANTER SPIKE VIAL GLASS SM (MISCELLANEOUS) IMPLANT
DRAPE EXTREMITY T 121X128X90 (DRAPE) ×2 IMPLANT
DRAPE OEC MINIVIEW 54X84 (DRAPES) ×2 IMPLANT
DRAPE U 20/CS (DRAPES) ×2 IMPLANT
DRAPE U-SHAPE 47X51 STRL (DRAPES) ×1 IMPLANT
DRSG EMULSION OIL 3X3 NADH (GAUZE/BANDAGES/DRESSINGS) ×2 IMPLANT
ELECT REM PT RETURN 9FT ADLT (ELECTROSURGICAL) ×2
ELECTRODE REM PT RTRN 9FT ADLT (ELECTROSURGICAL) ×1 IMPLANT
GAUZE SPONGE 4X4 16PLY XRAY LF (GAUZE/BANDAGES/DRESSINGS) IMPLANT
GLOVE BIOGEL PI IND STRL 8 (GLOVE) ×2 IMPLANT
GLOVE BIOGEL PI INDICATOR 8 (GLOVE) ×1
GLOVE ECLIPSE 7.5 STRL STRAW (GLOVE) ×4 IMPLANT
GLOVE SKINSENSE NS SZ7.0 (GLOVE) ×2
GLOVE SKINSENSE STRL SZ7.0 (GLOVE) IMPLANT
GOWN PREVENTION PLUS XLARGE (GOWN DISPOSABLE) ×3 IMPLANT
GOWN PREVENTION PLUS XXLARGE (GOWN DISPOSABLE) ×1 IMPLANT
GUIDEWARE NON THREAD 1.25X150 (WIRE) ×4
GUIDEWIRE NON THREAD 1.25X150 (WIRE) IMPLANT
KIT 1/3 TUB PL 6H 73M (Orthopedic Implant) IMPLANT
NEEDLE HYPO 22GX1.5 SAFETY (NEEDLE) IMPLANT
NS IRRIG 1000ML POUR BTL (IV SOLUTION) ×2 IMPLANT
PACK BASIN DAY SURGERY FS (CUSTOM PROCEDURE TRAY) ×2 IMPLANT
PAD CAST 4YDX4 CTTN HI CHSV (CAST SUPPLIES) ×1 IMPLANT
PADDING CAST ABS 4INX4YD NS (CAST SUPPLIES) ×1
PADDING CAST ABS COTTON 4X4 ST (CAST SUPPLIES) ×1 IMPLANT
PADDING CAST COTTON 4X4 STRL (CAST SUPPLIES) ×2
PADDING CAST COTTON 6X4 STRL (CAST SUPPLIES) IMPLANT
PENCIL BUTTON HOLSTER BLD 10FT (ELECTRODE) ×2 IMPLANT
PROS 1/3 TUB PL 6H 73M (Orthopedic Implant) ×2 IMPLANT
SCREW CANC FT/18 4.0 (Screw) ×2 IMPLANT
SCREW CANN L THRD/40 4.0 (Screw) ×2 IMPLANT
SCREW CORTEX 3.5 12MM (Screw) ×2 IMPLANT
SCREW CORTEX 3.5 14MM (Screw) ×1 IMPLANT
SCREW CORTEX 3.5 18MM (Screw) ×1 IMPLANT
SCREW CORTEX 3.5 20MM (Screw) ×1 IMPLANT
SCREW LOCK CORT ST 3.5X12 (Screw) IMPLANT
SCREW LOCK CORT ST 3.5X14 (Screw) IMPLANT
SCREW LOCK CORT ST 3.5X18 (Screw) IMPLANT
SCREW LOCK CORT ST 3.5X20 (Screw) IMPLANT
SLEEVE SCD COMPRESS KNEE MED (MISCELLANEOUS) ×1 IMPLANT
SPLINT FAST PLASTER 5X30 (CAST SUPPLIES) ×15
SPLINT PLASTER CAST FAST 5X30 (CAST SUPPLIES) IMPLANT
SPONGE GAUZE 4X4 12PLY (GAUZE/BANDAGES/DRESSINGS) ×2 IMPLANT
SPONGE LAP 4X18 X RAY DECT (DISPOSABLE) ×1 IMPLANT
STAPLER VISISTAT (STAPLE) IMPLANT
STOCKINETTE 6  STRL (DRAPES) ×1
STOCKINETTE 6 STRL (DRAPES) ×1 IMPLANT
STRIP CLOSURE SKIN 1/2X4 (GAUZE/BANDAGES/DRESSINGS) IMPLANT
SUCTION FRAZIER TIP 10 FR DISP (SUCTIONS) IMPLANT
SUT ETHILON 3 0 PS 1 (SUTURE) ×3 IMPLANT
SUT ETHILON 4 0 PS 2 18 (SUTURE) IMPLANT
SUT MON AB 4-0 PC3 18 (SUTURE) IMPLANT
SUT VIC AB 2-0 SH 27 (SUTURE) ×2
SUT VIC AB 2-0 SH 27XBRD (SUTURE) ×1 IMPLANT
SUT VIC AB 3-0 FS2 27 (SUTURE) ×1 IMPLANT
SUT VICRYL 4-0 PS2 18IN ABS (SUTURE) IMPLANT
SYR 20CC LL (SYRINGE) IMPLANT
SYR BULB 3OZ (MISCELLANEOUS) ×2 IMPLANT
TOWEL OR NON WOVEN STRL DISP B (DISPOSABLE) ×2 IMPLANT
TUBE CONNECTING 20X1/4 (TUBING) ×1 IMPLANT
UNDERPAD 30X30 INCONTINENT (UNDERPADS AND DIAPERS) ×2 IMPLANT
WATER STERILE IRR 1000ML POUR (IV SOLUTION) ×1 IMPLANT

## 2011-09-17 NOTE — H&P (Signed)
Kim Ferrell is an 66 y.o. female.   Chief Complaint: R ankle pain HPI: R ankle trimaleolar ankle fracture yesterday after slip and fall.  Past Medical History  Diagnosis Date  . Lichen sclerosus   . Psoriasis   . Migraines   . Depression   . GERD (gastroesophageal reflux disease)   . Chronic bronchitis   . Seasonal allergies   . Arthritis     hands, knees  . Trimalleolar fracture of right ankle 09/16/2011  . Hypertension     under control, has been on med. x 3-4 yrs.  . Asthma     has not needed any med. in 2 yrs.  . Dental crowns present     Past Surgical History  Procedure Date  . Abdominal hysterectomy     partial  . Hand surgery     left    Family History  Problem Relation Age of Onset  . Diabetes Mother   . Heart disease Mother   . Cancer Father     skin   Social History:  reports that she has quit smoking. She has never used smokeless tobacco. She reports that she drinks alcohol. She reports that she does not use illicit drugs.  Allergies:  Allergies  Allergen Reactions  . Adhesive (Tape) Rash  . Neosporin (Neomycin-Bacitracin Zn-Polymyx) Rash    Medications Prior to Admission  Medication Sig Dispense Refill  . buPROPion (WELLBUTRIN SR) 150 MG 12 hr tablet Take 150 mg by mouth daily. AM      . calcipotriene (DOVONOX) 0.005 % cream Apply 1 application topically 2 (two) times daily.      . fexofenadine (ALLEGRA) 180 MG tablet Take 180 mg by mouth daily.      . halobetasol (ULTRAVATE) 0.05 % cream Apply 1 application topically 2 (two) times daily.      Marland Kitchen HYDROcodone-acetaminophen (NORCO) 5-325 MG per tablet Take 2 tablets by mouth every 4 (four) hours as needed for pain.  30 tablet  0  . losartan-hydrochlorothiazide (HYZAAR) 100-25 MG per tablet Take 1 tablet by mouth daily.      . methotrexate (RHEUMATREX) 2.5 MG tablet Take 10 mg by mouth once a week. Caution:Chemotherapy. Protect from light.      Marland Kitchen omeprazole (PRILOSEC) 40 MG capsule Take 40 mg by mouth  daily.      . ondansetron (ZOFRAN) 4 MG tablet Take 1 tablet (4 mg total) by mouth every 8 (eight) hours as needed for nausea.  12 tablet  0  . PARoxetine (PAXIL) 20 MG tablet Take 20 mg by mouth every morning.      . SUMAtriptan (IMITREX) 100 MG tablet Take 100 mg by mouth every 2 (two) hours as needed. For headache        Results for orders placed during the hospital encounter of 09/16/11 (from the past 48 hour(s))  CBC     Status: Normal   Collection Time   09/16/11  9:45 AM      Component Value Range Comment   WBC 6.0  4.0 - 10.5 K/uL    RBC 4.09  3.87 - 5.11 MIL/uL    Hemoglobin 12.7  12.0 - 15.0 g/dL    HCT 16.1  09.6 - 04.5 %    MCV 88.8  78.0 - 100.0 fL    MCH 31.1  26.0 - 34.0 pg    MCHC 35.0  30.0 - 36.0 g/dL    RDW 40.9  81.1 - 91.4 %    Platelets 269  150 - 400 K/uL   DIFFERENTIAL     Status: Normal   Collection Time   09/16/11  9:45 AM      Component Value Range Comment   Neutrophils Relative 65  43 - 77 %    Neutro Abs 3.9  1.7 - 7.7 K/uL    Lymphocytes Relative 24  12 - 46 %    Lymphs Abs 1.4  0.7 - 4.0 K/uL    Monocytes Relative 8  3 - 12 %    Monocytes Absolute 0.5  0.1 - 1.0 K/uL    Eosinophils Relative 3  0 - 5 %    Eosinophils Absolute 0.2  0.0 - 0.7 K/uL    Basophils Relative 1  0 - 1 %    Basophils Absolute 0.0  0.0 - 0.1 K/uL   BASIC METABOLIC PANEL     Status: Abnormal   Collection Time   09/16/11  9:45 AM      Component Value Range Comment   Sodium 138  135 - 145 mEq/L    Potassium 3.7  3.5 - 5.1 mEq/L    Chloride 101  96 - 112 mEq/L    CO2 29  19 - 32 mEq/L    Glucose, Bld 121 (*) 70 - 99 mg/dL    BUN 18  6 - 23 mg/dL    Creatinine, Ser 1.61  0.50 - 1.10 mg/dL    Calcium 9.3  8.4 - 09.6 mg/dL    GFR calc non Af Amer 75 (*) >90 mL/min    GFR calc Af Amer 87 (*) >90 mL/min    Dg Chest 2 View  09/16/2011  *RADIOLOGY REPORT*  Clinical Data: Fall with right-sided pain.  CHEST - 2 VIEW  Comparison: 02/10/2008  Findings: Upper limits normal heart size  noted. Interstitial prominence is again identified. There is no evidence of focal airspace disease, pulmonary edema, suspicious pulmonary nodule/mass, pleural effusion, or pneumothorax. No acute bony abnormalities are identified.  IMPRESSION: No evidence of acute cardiopulmonary disease.  Mild chronic interstitial prominence and upper limits normal heart size.  Original Report Authenticated By: Rosendo Gros, M.D.   Dg Cervical Spine Complete  09/16/2011  *RADIOLOGY REPORT*  Clinical Data: 66 year old female with neck pain following fall.  CERVICAL SPINE - COMPLETE 4+ VIEW  Comparison: 04/27/2008  Findings: Normal alignment is noted. There is no evidence of fracture, subluxation or prevertebral soft tissue swelling. Mild degenerative disc disease/spondylosis is noted, greatest at C5- C7. No focal bony lesions are identified.  IMPRESSION: No static evidence of acute injury to the cervical spine.  Mild multilevel degenerative changes.  Original Report Authenticated By: Rosendo Gros, M.D.   Dg Tibia/fibula Right  09/16/2011  *RADIOLOGY REPORT*  Clinical Data: Slipped and fell on wet steps injuring right ankle, deformity and bleeding  RIGHT TIBIA AND FIBULA - 2 VIEW  Comparison: None  Findings: Question mild osseous demineralization. Knee joint spaces preserved. Ankle reported separately. No additional tibial or fibular fracture identified. Regional soft tissues unremarkable.  IMPRESSION: Trimalleolar fractures of the right ankle as reported separately on right ankle radiographs. No additional right tibial or fibular abnormality identified.  Original Report Authenticated By: Lollie Marrow, M.D.   Dg Ankle Complete Right  09/16/2011  *RADIOLOGY REPORT*  Clinical Data: Slipped and fell on wet steps injuring right ankle, deformity, medial bleeding  RIGHT ANKLE - COMPLETE 3+ VIEW  Comparison: None  Findings: Question mild osseous demineralization. Oblique lateral malleolar fracture displaced laterally and  posteriorly. Distracted transverse medial malleolar fracture. Tiny nondisplaced posterior medial malleolar fracture. Minimal widening at medial joint line. Overlying soft tissue swelling.  IMPRESSION: Trimalleolar fractures of the right ankle as above.  Original Report Authenticated By: Lollie Marrow, M.D.    Review of Systems  All other systems reviewed and are negative.    Blood pressure 133/80, pulse 71, temperature 98.2 F (36.8 C), temperature source Oral, resp. rate 20, height 5\' 5"  (1.651 m), weight 69.854 kg (154 lb), last menstrual period 04/02/1987, SpO2 96.00%. Physical Exam  Constitutional: She is oriented to person, place, and time. She appears well-developed and well-nourished.  HENT:  Head: Atraumatic.  Eyes: EOM are normal.  Cardiovascular: Intact distal pulses.   Respiratory: Effort normal.  Musculoskeletal:       R ankle swollen, but with some skin wrinkles Small abrasion medially and small fracture blister anterolateral away from surgical site Wiggles toes NVID  Neurological: She is alert and oriented to person, place, and time.  Skin: Skin is warm and dry.  Psychiatric: She has a normal mood and affect.     Assessment/Plan R ankle trimalleolar ankle fracture Plan ORIF Risks / benefits of surgery discussed Consent on chart  NPO for OR Preop antibiotics   Shiquita Collignon WILLIAM 09/17/2011, 11:44 AM

## 2011-09-17 NOTE — Discharge Instructions (Addendum)
Keep splint clean and dry Elevate the right lower extremity on pillows for at least 48 hours postoperatively. Keep the bag of ice on the ankle for the first 48 hours. Do not put any weight down on the right lower extremity. Wiggle your toes frequently. If you don't have an intolerance to aspirin, taken an aspirin a day for the next 3 weeks.

## 2011-09-17 NOTE — Anesthesia Procedure Notes (Addendum)
Anesthesia Regional Block:   Narrative:    Anesthesia Regional Block:  Popliteal block  Pre-Anesthetic Checklist: ,, timeout performed, Correct Patient, Correct Site, Correct Laterality, Correct Procedure, Correct Position, site marked, Risks and benefits discussed,  Surgical consent,  Pre-op evaluation,  At surgeon's request and post-op pain management  Laterality: Right  Prep: chloraprep       Needles:  Injection technique: Single-shot  Needle Type: Stimulator Needle - 80     Needle Length:cm 9 cm Needle Gauge: 22 and 22 G    Additional Needles:  Procedures: nerve stimulator Popliteal block  Nerve Stimulator or Paresthesia:  Response: toe dorsiflexion, 0.45 mA, 0.1 ms,  Response: toe abduction, 0.45 mA, 0.1 ms,   Additional Responses:   Narrative:  Start time: 09/17/2011 1:06 PM End time: 09/17/2011 1:12 PM Injection made incrementally with aspirations every 5 mL.  Performed by: Personally  Anesthesiologist: Sandford Craze, MD  Additional Notes: Pt identified in Holding room.  Monitors applied. Working IV access confirmed. Sterile prep R lateral knee.  #[id number] PNS to toe dorsiflexion and toe abduction twitches at 0.104mA threshold.  30cc 0.5% Bupivacaine with 1:200k epi injected incrementally after negative test dose.  Reprep prox, ant, med tibia, 10cc 0.5% Bupivacaine with 1:200k epi injected subq for saphenous nerve supplementation. Patient asymptomatic, VSS, no heme aspirated, tolerated well.   Sandford Craze, MD  Popliteal block Procedure Name: Intubation Date/Time: 09/17/2011 1:43 PM Performed by: Zenia Resides D Pre-anesthesia Checklist: Patient identified, Emergency Drugs available, Suction available, Patient being monitored and Timeout performed Patient Re-evaluated:Patient Re-evaluated prior to inductionOxygen Delivery Method: Circle System Utilized Preoxygenation: Pre-oxygenation with 100% oxygen Intubation Type: IV induction and Cricoid Pressure applied Ventilation:  Mask ventilation without difficulty Laryngoscope Size: Mac and 3 Grade View: Grade I Tube type: Oral Number of attempts: 1 Airway Equipment and Method: stylet and oral airway Placement Confirmation: ETT inserted through vocal cords under direct vision,  positive ETCO2 and breath sounds checked- equal and bilateral Secured at: 23 cm Tube secured with: Tape Dental Injury: Teeth and Oropharynx as per pre-operative assessment  Comments: Induction started.  Laryngoscope opened for intubation and blade disconnected from handle and fell on patients nose.  A scrape/abrasion is noted, pressure is applied as well as ice intermitently.

## 2011-09-17 NOTE — Transfer of Care (Signed)
Immediate Anesthesia Transfer of Care Note  Patient: Kim Ferrell  Procedure(s) Performed: Procedure(s) (LRB): OPEN REDUCTION INTERNAL FIXATION (ORIF) ANKLE FRACTURE (Right)  Patient Location: PACU  Anesthesia Type: General and Regional  Level of Consciousness: awake, alert  and oriented  Airway & Oxygen Therapy: Patient Spontanous Breathing and Patient connected to face mask oxygen  Post-op Assessment: Report given to PACU RN and Post -op Vital signs reviewed and stable  Post vital signs: Reviewed and stable  Complications: No apparent anesthesia complications

## 2011-09-17 NOTE — Anesthesia Postprocedure Evaluation (Signed)
  Anesthesia Post-op Note  Patient: Kim Ferrell  Procedure(s) Performed: Procedure(s) (LRB): OPEN REDUCTION INTERNAL FIXATION (ORIF) ANKLE FRACTURE (Right)  Patient Location: PACU  Anesthesia Type: General  Level of Consciousness: awake  Airway and Oxygen Therapy: Patient Spontanous Breathing  Post-op Pain: mild  Post-op Assessment: Post-op Vital signs reviewed  Post-op Vital Signs: Reviewed  Complications: No apparent anesthesia complications

## 2011-09-17 NOTE — Progress Notes (Signed)
Per pt and husband states Dr Ave Filter told us she could stay overnight and I don't think she should come home tonight. Problems with mobility and pt states "my ankle hurts really bad when I move my leg and I don't feel comfortable going home tonight". TC to on call - Gus Puma PA  Informed of above and past h/o of asthma and recent albuterol tx given. See overnight orders

## 2011-09-17 NOTE — Progress Notes (Signed)
Assisted Dr. Jackson with right, popliteal block. Side rails up, monitors on throughout procedure. See vital signs in flow sheet. Tolerated Procedure well. 

## 2011-09-17 NOTE — Anesthesia Preprocedure Evaluation (Signed)
Anesthesia Evaluation  Patient identified by MRN, date of birth, ID band Patient awake    Reviewed: Allergy & Precautions, H&P , NPO status , Patient's Chart, lab work & pertinent test results  History of Anesthesia Complications Negative for: history of anesthetic complications  Airway Mallampati: I TM Distance: >3 FB Neck ROM: Full    Dental No notable dental hx. (+) Teeth Intact and Dental Advisory Given   Pulmonary asthma (no inhaler needed in years) , former smoker (quit 30 years) breath sounds clear to auscultation  Pulmonary exam normal       Cardiovascular hypertension, Pt. on medications Rhythm:Regular Rate:Normal     Neuro/Psych negative neurological ROS     GI/Hepatic Neg liver ROS, GERD-  Medicated and Controlled,  Endo/Other  negative endocrine ROS  Renal/GU negative Renal ROS     Musculoskeletal   Abdominal   Peds  Hematology negative hematology ROS (+)   Anesthesia Other Findings   Reproductive/Obstetrics                           Anesthesia Physical Anesthesia Plan  ASA: II  Anesthesia Plan: General   Post-op Pain Management:    Induction: Intravenous  Airway Management Planned: Oral ETT  Additional Equipment:   Intra-op Plan:   Post-operative Plan: Extubation in OR  Informed Consent: I have reviewed the patients History and Physical, chart, labs and discussed the procedure including the risks, benefits and alternatives for the proposed anesthesia with the patient or authorized representative who has indicated his/her understanding and acceptance.   Dental advisory given  Plan Discussed with: Surgeon and CRNA  Anesthesia Plan Comments: (Plan routine monitors, GETA with popliteal block for post op analgesia)        Anesthesia Quick Evaluation

## 2011-09-17 NOTE — Op Note (Signed)
Procedure(s): OPEN REDUCTION INTERNAL FIXATION (ORIF) ANKLE FRACTURE Procedure Note  Therma Lasure female 66 y.o. 09/17/2011  Procedure(s) and Anesthesia Type:    * OPEN REDUCTION INTERNAL FIXATION (ORIF) right bimalleolar ANKLE FRACTURE - General  Preoperative diagnosis: Right ankle bimalleolar fracture  Surgeon(s) and Role:    * Mable Paris, MD - Primary   Indications:  67 y.o. female s/p fall with right ankle fracture yesterday. Indicated for surgery to promote anatomic restoration of joint.     Surgeon: Mable Paris   Assistants: None  Anesthesia: General endotracheal anesthesia with popliteal block    Procedure Detail  OPEN REDUCTION INTERNAL FIXATION (ORIF) ANKLE FRACTURE  Findings: Anatomic reduction of the fracture both medial and lateral with a one third tubular plate laterally with one 3.5 anterior to posterior lag screw across the fracture. Medially 2 4.0 partially-threaded cannulated cancellus screws were used. Bone quality was poor. She was noted to have 1 small fracture blister anterolaterally which was well away from the operative site. Medially there was a small abrasion which was also away from the operative site.  Estimated Blood Loss:  Minimal         Drains: none  Blood Given: none         Specimens: none        Complications:  * No complications entered in OR log *         Disposition: PACU - hemodynamically stable.         Condition: stable    Procedure:  The patient was identified in the preoperative  holding area where I personally marked the operative site after  verifying site side and procedure with the patient. The patient was taken back  to the operating room where general anesthesia was induced without  Complication. The patient was placed in supine position with a bump under the operative hip. A non sterile tourniquet was applied to the thigh. The patient did receive IV antibiotics prior to the incision.   After  the appropriate time-out, the limb was exsanguinated and the tourniquet was elevated to 300 mmHg.   A lateral incision was made over the fracture site and dissection was carried down the lateral fibula.  The fracture was a exposed and cleaned of hematoma.  Reduction was carried out using reduction forceps and manipulation of the ankle.  An interfragmentary lag screw was placed by over drilling the anterior cortex with a 3.5 mm drill. Fixation was fairly poor..  A 6 hole one third tubular plate was laid laterally and felt to be appropriate sized.  Holes proximal and distal to the fracture were sequentially drill, measured and filled with appropriate sized bicortical and unicortical screws.  Appropriate length and alignment were verified on fluoroscopic imaging.  Bone quality was noted to be marginal.  Attention was then turned to the medial side were a approximate 4 cm curvalinear incision was made over the distal medial malleolus.  Dissection was carried down to the fracture and the fracture was exposed. The joint was irrigated.  No loose fragment were noted in the joint.  The fracture was held anatomically reduced with a reduction forceps and two guidewires were passed across the fracture. The reduction and pin position was verified with fluoro and then 2 40 mm partially threaded cancellous screws were placed after over drilling the outer cortex.  Good fixation was noted.  The syndesmosis was stressed and felt to be stable.  The wounds were then copiously irrigated and closed in layers with  2-0 vicryl in a deep layer 3-0 Vicryl in a deep skin layer and 3-0 nylon for skin closure.  Sterile dressings were then applied and well padded well molded splint in a plantigrade position was applied.  The tourniquet was let down for total tourniquet time of slightly longer than one hour  The patient was then allowed to awaken from GA, taken to the PACU in stable condition.  POSTOPERATIVE PLAN: The patient will be  non-weightbearing on the operative Extremity and will follow up in 10-14 days for wound check.

## 2011-09-18 ENCOUNTER — Encounter (HOSPITAL_BASED_OUTPATIENT_CLINIC_OR_DEPARTMENT_OTHER): Payer: Self-pay | Admitting: Orthopedic Surgery

## 2012-02-04 ENCOUNTER — Encounter: Payer: Self-pay | Admitting: Gynecology

## 2012-02-04 ENCOUNTER — Ambulatory Visit (INDEPENDENT_AMBULATORY_CARE_PROVIDER_SITE_OTHER): Payer: Medicare Other | Admitting: Gynecology

## 2012-02-04 DIAGNOSIS — N952 Postmenopausal atrophic vaginitis: Secondary | ICD-10-CM

## 2012-02-04 DIAGNOSIS — L900 Lichen sclerosus et atrophicus: Secondary | ICD-10-CM

## 2012-02-04 DIAGNOSIS — L940 Localized scleroderma [morphea]: Secondary | ICD-10-CM

## 2012-02-04 MED ORDER — CLOBETASOL PROPIONATE 0.05 % EX CREA: TOPICAL_CREAM | CUTANEOUS | Status: DC

## 2012-02-04 MED ORDER — ESTRADIOL 0.1 MG/GM VA CREA: 2.0000 g | TOPICAL_CREAM | Freq: Every day | VAGINAL | Status: DC

## 2012-02-04 MED ORDER — FLUCONAZOLE 150 MG PO TABS: 150.0000 mg | ORAL_TABLET | Freq: Once | ORAL | Status: DC

## 2012-02-04 NOTE — Patient Instructions (Signed)
Apply estrogen cream externally on night and alternate with clobesterol cream the other night. Follow up in one month for reexamination. Take one Diflucan pill.

## 2012-02-04 NOTE — Progress Notes (Signed)
Patient presents one-week history of vaginal eructation.  Has been treated for similar complaints before. History of atrophic changes and lichen sclerosus. No discharge or itching.  Being followed for fragile skin.  Exam was kim assistant Pelvic external with symmetrical peri-introital white changes loss of skin folds consistent with lichen sclerosus. Areas of bruising along the right labia majora. No ulcerations or skin breaks. Vagina with atrophic changes. Bimanual without masses or tenderness.  Assessment and plan: Atrophic vaginitis with lichen sclerosus. No overt evidence of infection. Did recommend on Diflucan 150 mg in the event that there is low level yeast. We'll treat with Estrace vaginal cream alternating nightly with Temovate 0.05% cream times one month with follow up appointment. Issues of estrogen absorption we discussed as in the past to include increased risk of stroke heart attack DVT of breast cancer issues. Patient understands and accepts. Hopefully treating both the atrophic vaginitis as well as the lichen sclerosus will eradicate her symptoms.

## 2012-02-17 ENCOUNTER — Encounter: Payer: Self-pay | Admitting: Gynecology

## 2012-03-04 ENCOUNTER — Encounter: Payer: Self-pay | Admitting: Gynecology

## 2012-03-04 ENCOUNTER — Ambulatory Visit (INDEPENDENT_AMBULATORY_CARE_PROVIDER_SITE_OTHER): Payer: Medicare Other | Admitting: Gynecology

## 2012-03-04 VITALS — Wt 155.0 lb

## 2012-03-04 DIAGNOSIS — L940 Localized scleroderma [morphea]: Secondary | ICD-10-CM

## 2012-03-04 DIAGNOSIS — N952 Postmenopausal atrophic vaginitis: Secondary | ICD-10-CM

## 2012-03-04 DIAGNOSIS — L900 Lichen sclerosus et atrophicus: Secondary | ICD-10-CM

## 2012-03-04 MED ORDER — ESTRADIOL 0.1 MG/GM VA CREA: 2.0000 g | TOPICAL_CREAM | Freq: Every day | VAGINAL | Status: DC

## 2012-03-04 NOTE — Progress Notes (Signed)
Patient presents in follow up having been seen one month ago with significant vulvar irritation and pain.  Has biopsy-proven lichen sclerosus and atrophic changes. We treated her with Estrace vaginal cream every other night and Temovate cream every other night. Patient notes her symptoms have dramatically improved.  Exam with Fleet Contras assistant External BUS vagina with atrophic changes. Generalized whitish appearance with loss of skin folds consistent with lichen sclerosus. Bruising along the right labia old in appearance. Bimanual without masses or tenderness.  Assessment and plan: Lichen sclerosus with significant atrophic changes good response to Estrace vaginal cream and Temovate. Options to continue Estrace/trial of Osphena/Vagifem reviewed. Patient's comfortable with the Estrace. We have discussed the absorption issues and possible increased risk of stroke heart attack DVT. She is status post hysterectomy. She'll continue with every other night Estrace. We'll stop the Temovate for now and only uses for when necessary flares. After turning 3 months for reevaluation.  Patient asked about Pap smear screening. She is status post hysterectomy for benign indications with no history of abnormal Pap smears previously. She's also over the age of 73 and I recommended to stop screening and she is comfortable with this.

## 2012-03-04 NOTE — Patient Instructions (Signed)
Use Estrace vaginal cream every other night. Use Temovate cream as needed for itching or worsening symptoms. Follow up in 3 months for reevaluation.

## 2012-03-13 ENCOUNTER — Encounter: Payer: Self-pay | Admitting: Gynecology

## 2012-06-04 ENCOUNTER — Encounter: Payer: Self-pay | Admitting: Gynecology

## 2012-06-04 ENCOUNTER — Ambulatory Visit (INDEPENDENT_AMBULATORY_CARE_PROVIDER_SITE_OTHER): Payer: Medicare Other | Admitting: Gynecology

## 2012-06-04 DIAGNOSIS — L940 Localized scleroderma [morphea]: Secondary | ICD-10-CM

## 2012-06-04 NOTE — Patient Instructions (Signed)
Continue using the estrogen cream twice weekly. Use the clobetasol cream as needed for symptoms. Followup with me in one year, sooner if issues.

## 2012-06-04 NOTE — Progress Notes (Signed)
Patient presents at 3 month recheck for her lichen sclerosis. Using Estrace vaginal cream initially several times weekly now down to once a week and clobetasol as needed for symptoms. She notes that she is symptom-free now and doing well.  Exam with Selena Batten assistant External BUS vagina with atrophic changes and whitish changes consistent with lichen sclerosus. Some hemorrhagic bruising inner fold of left labium minora. Vaginal exam atrophic changes. Bimanual without masses or tenderness  Assessment and plan: Atrophic vaginitis/lichen sclerosus. Recommended Estrace cream twice weekly to so we do not have a recurrence of her symptoms and then use the Temovate as needed. The issues of absorption with estrogen cream again discussed with her. She normally sees Dr. Jacky Kindle who does her complete exams to include breast exams she'll continue to followup with him and I asked her see me in one year assuming that she continues well in the interim for reinspection.

## 2012-06-08 ENCOUNTER — Telehealth: Payer: Self-pay | Admitting: *Deleted

## 2012-06-08 NOTE — Telephone Encounter (Signed)
Message copied by Aura Camps on Mon Jun 08, 2012  8:35 AM ------      Message from: Dara Lords      Created: Thu Jun 04, 2012 10:40 AM       Help the patient arrange appointment with Gen. surgery in reference to hemorrhoids questionable need for surgery ------

## 2012-06-08 NOTE — Telephone Encounter (Signed)
Left message on [facility] referral coordinator with the below to schedule appointment.

## 2012-06-12 ENCOUNTER — Encounter (INDEPENDENT_AMBULATORY_CARE_PROVIDER_SITE_OTHER): Payer: Self-pay | Admitting: Surgery

## 2012-06-12 ENCOUNTER — Ambulatory Visit (INDEPENDENT_AMBULATORY_CARE_PROVIDER_SITE_OTHER): Payer: Medicare Other | Admitting: Surgery

## 2012-06-12 VITALS — BP 116/74 | HR 80 | Temp 98.4°F | Resp 12 | Ht 65.0 in | Wt 158.6 lb

## 2012-06-12 DIAGNOSIS — K649 Unspecified hemorrhoids: Secondary | ICD-10-CM

## 2012-06-12 MED ORDER — HYDROCORTISONE ACE-PRAMOXINE 1-1 % RE FOAM: 1.0000 | Freq: Two times a day (BID) | RECTAL | Status: DC

## 2012-06-12 MED ORDER — POLYETHYLENE GLYCOL 3350 17 GM/SCOOP PO POWD: 17.0000 g | Freq: Every day | ORAL | Status: DC

## 2012-06-12 NOTE — Progress Notes (Signed)
Patient ID: Kim Ferrell, female   DOB: July 04, 1945, 67 y.o.   MRN: [id number]  Chief Complaint  Patient presents with  . Hemorrhoids    HPI Kim Ferrell is a 67 y.o. female.  Patient sent at the request of Dr.Fontaine for hemorrhoids. She's had them for a number of years. She has trouble with constipation and straining. Occasional bleeding. Some burning. No itching. HPI  Past Medical History  Diagnosis Date  . Lichen sclerosus   . Psoriasis   . Migraines   . Depression   . GERD (gastroesophageal reflux disease)   . Chronic bronchitis   . Seasonal allergies   . Arthritis     hands, knees  . Trimalleolar fracture of right ankle 09/16/2011  . Hypertension     under control, has been on med. x 3-4 yrs.  . Asthma     has not needed any med. in 2 yrs.  . Dental crowns present     Past Surgical History  Procedure Laterality Date  . Abdominal hysterectomy      partial  . Hand surgery      left  . Orif ankle fracture  09/17/2011    Procedure: OPEN REDUCTION INTERNAL FIXATION (ORIF) ANKLE FRACTURE;  Surgeon: Mable Paris, MD;  Location: [facility];  Service: Orthopedics;  Laterality: Right;    Family History  Problem Relation Age of Onset  . Diabetes Mother   . Heart disease Mother   . Cancer Father     skin  . Heart disease Father   . Stroke Sister   . Heart disease Brother     Social History History  Substance Use Topics  . Smoking status: Former Games developer  . Smokeless tobacco: Never Used     Comment: quit smoking 36 yrs. ago  . Alcohol Use: 0.0 oz/week     Comment: daily glass of wine    Allergies  Allergen Reactions  . Adhesive (Tape) Rash  . Neosporin (Neomycin-Bacitracin Zn-Polymyx) Rash    Current Outpatient Prescriptions  Medication Sig Dispense Refill  . buPROPion (WELLBUTRIN SR) 150 MG 12 hr tablet Take 150 mg by mouth daily. AM      . clobetasol cream (TEMOVATE) 0.05 % Apply topically 2 (two) times daily.      . clobetasol cream  (TEMOVATE) 0.05 % Apply every other night  30 g  1  . estradiol (ESTRACE VAGINAL) 0.1 MG/GM vaginal cream Place 0.25 Applicatorfuls vaginally daily.  42.5 g  2  . fexofenadine (ALLEGRA) 180 MG tablet Take 180 mg by mouth daily.      . halobetasol (ULTRAVATE) 0.05 % cream Apply 1 application topically 2 (two) times daily.      Marland Kitchen losartan-hydrochlorothiazide (HYZAAR) 100-25 MG per tablet Take 1 tablet by mouth daily.      Marland Kitchen omeprazole (PRILOSEC) 40 MG capsule Take 40 mg by mouth daily.      Marland Kitchen PARoxetine (PAXIL) 20 MG tablet Take 20 mg by mouth every morning.      . predniSONE (STERAPRED UNI-PAK) 5 MG TABS       . SUMAtriptan (IMITREX) 100 MG tablet Take 100 mg by mouth every 2 (two) hours as needed. For headache      . hydrocortisone-pramoxine (PROCTOFOAM HC) rectal foam Place 1 applicator rectally 2 (two) times daily.  10 g  0  . polyethylene glycol powder (GLYCOLAX) powder Take 17 g by mouth daily.  255 g  0   No current facility-administered medications for this visit.  Review of Systems Review of Systems  Constitutional: Negative.   HENT: Negative.   Gastrointestinal: Positive for constipation.  Endocrine: Negative.   Hematological: Negative.     Blood pressure 116/74, pulse 80, temperature 98.4 F (36.9 C), resp. rate 12, height 5\' 5"  (1.651 m), weight 158 lb 9.6 oz (71.94 kg), last menstrual period 04/02/1987.  Physical Exam Physical Exam  HENT:  Head: Normocephalic and atraumatic.  Eyes: Conjunctivae and EOM are normal. Pupils are equal, round, and reactive to light.  Neck: Normal range of motion. Neck supple.  Genitourinary:         Assessment    Grade 3 3 column internal and external hemorrhoid     Plan    Medical management for now after discussion with patient of all options. Return if no improvement.       CORNETT,THOMAS A. 06/12/2012, 10:10 AM

## 2012-06-12 NOTE — Patient Instructions (Signed)
Hemorrhoids Hemorrhoids are enlarged (dilated) veins around the rectum. There are 2 types of hemorrhoids, and the type of hemorrhoid is determined by its location. Internal hemorrhoids occur in the veins just inside the rectum.They are usually not painful, but they may bleed.However, they may poke through to the outside and become irritated and painful. External hemorrhoids involve the veins outside the anus and can be felt as a painful swelling or hard lump near the anus.They are often itchy and may crack and bleed. Sometimes clots will form in the veins. This makes them swollen and painful. These are called thrombosed hemorrhoids. CAUSES Causes of hemorrhoids include:  Pregnancy. This increases the pressure in the hemorrhoidal veins.  Constipation.  Straining to have a bowel movement.  Obesity.  Heavy lifting or other activity that caused you to strain. TREATMENT Most of the time hemorrhoids improve in 1 to 2 weeks. However, if symptoms do not seem to be getting better or if you have a lot of rectal bleeding, your caregiver may perform a procedure to help make the hemorrhoids get smaller or remove them completely.Possible treatments include:  Rubber band ligation. A rubber band is placed at the base of the hemorrhoid to cut off the circulation.  Sclerotherapy. A chemical is injected to shrink the hemorrhoid.  Infrared light therapy. Tools are used to burn the hemorrhoid.  Hemorrhoidectomy. This is surgical removal of the hemorrhoid. HOME CARE INSTRUCTIONS   Increase fiber in your diet. Ask your caregiver about using fiber supplements.  Drink enough water and fluids to keep your urine clear or pale yellow.  Exercise regularly.  Go to the bathroom when you have the urge to have a bowel movement. Do not wait.  Avoid straining to have bowel movements.  Keep the anal area dry and clean.  Only take over-the-counter or prescription medicines for pain, discomfort, or fever as  directed by your caregiver. If your hemorrhoids are thrombosed:  Take warm sitz baths for 20 to 30 minutes, 3 to 4 times per day.  If the hemorrhoids are very tender and swollen, place ice packs on the area as tolerated. Using ice packs between sitz baths may be helpful. Fill a plastic bag with ice. Place a towel between the bag of ice and your skin.  Medicated creams and suppositories may be used or applied as directed.  Do not use a donut-shaped pillow or sit on the toilet for long periods. This increases blood pooling and pain. SEEK MEDICAL CARE IF:   You have increasing pain and swelling that is not controlled with your medicine.  You have uncontrolled bleeding.  You have difficulty or you are unable to have a bowel movement.  You have pain or inflammation outside the area of the hemorrhoids.  You have chills or an oral temperature above 102 F (38.9 C). MAKE SURE YOU:   Understand these instructions.  Will watch your condition.  Will get help right away if you are not doing well or get worse. Document Released: 03/15/2000 Document Revised: 06/10/2011 Document Reviewed: 02/26/2010 [facility] Patient Information 2013 [place], [place].  High-Fiber Diet Fiber is found in fruits, vegetables, and grains. A high-fiber diet encourages the addition of more whole grains, legumes, fruits, and vegetables in your diet. The recommended amount of fiber for adult males is 38 g per day. For adult females, it is 25 g per day. Pregnant and lactating women should get 28 g of fiber per day. If you have a digestive or bowel problem, ask your caregiver  for advice before adding high-fiber foods to your diet. Eat a variety of high-fiber foods instead of only a select few type of foods.  PURPOSE  To increase stool bulk.  To make bowel movements more regular to prevent constipation.  To lower cholesterol.  To prevent overeating. WHEN IS THIS DIET USED?  It may be used if you have constipation and  hemorrhoids.  It may be used if you have uncomplicated diverticulosis (intestine condition) and irritable bowel syndrome.  It may be used if you need help with weight management.  It may be used if you want to add it to your diet as a protective measure against atherosclerosis, diabetes, and cancer. SOURCES OF FIBER  Whole-grain breads and cereals.  Fruits, such as apples, oranges, bananas, berries, prunes, and pears.  Vegetables, such as green peas, carrots, sweet potatoes, beets, broccoli, cabbage, spinach, and artichokes.  Legumes, such split peas, soy, lentils.  Almonds. FIBER CONTENT IN FOODS Starches and Grains / Dietary Fiber (g)  Cheerios, 1 cup / 3 g  Corn Flakes cereal, 1 cup / 0.7 g  Rice crispy treat cereal, 1 cup / 0.3 g  Instant oatmeal (cooked),  cup / 2 g  Frosted wheat cereal, 1 cup / 5.1 g  Brown, long-grain rice (cooked), 1 cup / 3.5 g  White, long-grain rice (cooked), 1 cup / 0.6 g  Enriched macaroni (cooked), 1 cup / 2.5 g Legumes / Dietary Fiber (g)  Baked beans (canned, plain, or vegetarian),  cup / 5.2 g  Kidney beans (canned),  cup / 6.8 g  Pinto beans (cooked),  cup / 5.5 g Breads and Crackers / Dietary Fiber (g)  Plain or honey graham crackers, 2 squares / 0.7 g  Saltine crackers, 3 squares / 0.3 g  Plain, salted pretzels, 10 pieces / 1.8 g  Whole-wheat bread, 1 slice / 1.9 g  White bread, 1 slice / 0.7 g  Raisin bread, 1 slice / 1.2 g  Plain bagel, 3 oz / 2 g  Flour tortilla, 1 oz / 0.9 g  Corn tortilla, 1 small / 1.5 g  Hamburger or hotdog bun, 1 small / 0.9 g Fruits / Dietary Fiber (g)  Apple with skin, 1 medium / 4.4 g  Sweetened applesauce,  cup / 1.5 g  Banana,  medium / 1.5 g  Grapes, 10 grapes / 0.4 g  Orange, 1 small / 2.3 g  Raisin, 1.5 oz / 1.6 g  Melon, 1 cup / 1.4 g Vegetables / Dietary Fiber (g)  Green beans (canned),  cup / 1.3 g  Carrots (cooked),  cup / 2.3 g  Broccoli (cooked),   cup / 2.8 g  Peas (cooked),  cup / 4.4 g  Mashed potatoes,  cup / 1.6 g  Lettuce, 1 cup / 0.5 g  Corn (canned),  cup / 1.6 g  Tomato,  cup / 1.1 g Document Released: 03/18/2005 Document Revised: 09/17/2011 Document Reviewed: 06/20/2011 [facility] Patient Information 2013 [place], [place].    MIRALAX  DAILY AS DIRECTED FIBER SUPPLEMENT   OF CHOICE  IE GUMMY FIBER,  FIBER CON,  METAMUCIL,  OR ANY THAT TASTES GOOD DAILY WILL CALL [facility] FOR TOPICAL MEDICATIONS

## 2012-06-15 NOTE — Telephone Encounter (Signed)
appt 06/12/12 @ 9:40 am

## 2012-10-15 ENCOUNTER — Other Ambulatory Visit: Payer: Self-pay | Admitting: Neurological Surgery

## 2012-10-22 ENCOUNTER — Encounter (HOSPITAL_COMMUNITY): Payer: Self-pay | Admitting: Pharmacy Technician

## 2012-10-26 NOTE — Pre-Procedure Instructions (Addendum)
Raneisha Bress  10/26/2012   Your procedure is scheduled on: Monday, November 02, 2012  Report to [facility] at 9:45 AM.  Call this number if you have problems the morning of surgery: [phone]   Remember:   Do not eat food or drink liquids after midnight.   Take these medicines the morning of surgery with A SIP OF WATER: buPROPion (WELLBUTRIN SR) 100 MG 12 hr tablet,   fexofenadine (ALLEGRA) 180 MG tablet, omeprazole (PRILOSEC) 20 MG capsule, PARoxetine (PAXIL) 10 MG tablet   If needed: SUMAtriptan (IMITREX) 100 MG tablet for Migraine headache, albuterol (PROVENTIL HFA;VENTOLIN HFA for shortness of  breath ( bring inhaler on day of surgery_) fluticasone (FLONASE) 50 MCG/ACT nasal spray  Stop taking Aspirin and herbal medications. Do not take any NSAIDs ie: Ibuprofen, Advil, Naproxen or any medication containing Aspirin.   Do not wear jewelry, make-up or nail polish.  Do not wear lotions, powders, or perfumes. You may wear deodorant.  Do not shave 48 hours prior to surgery.   Do not bring valuables to the hospital.  [facility] is not responsible for any belongings or valuables.  Contacts, dentures or bridgework may not be worn into surgery.  Leave suitcase in the car. After surgery it may be brought to your room.  For patients admitted to the hospital, checkout time is 11:00 AM the day of discharge.   Patients discharged the day of surgery will not be allowed to drive home.  Name and phone number of your driver:   Special Instructions: Shower using CHG 2 nights before surgery and the night before surgery.  If you shower the day of surgery use CHG.  Use special wash - you have one bottle of CHG for all showers.  You should use approximately 1/3 of the bottle for each shower.   Please read over the following fact sheets that you were given: Pain Booklet, Coughing and Deep Breathing, MRSA Information and Surgical Site Infection Prevention

## 2012-10-27 ENCOUNTER — Encounter (HOSPITAL_COMMUNITY)
Admission: RE | Admit: 2012-10-27 | Discharge: 2012-10-27 | Disposition: A | Discharge status: Home / Self Care | Payer: Medicare Other | Source: Ambulatory Visit | Attending: Anesthesiology | Admitting: Anesthesiology

## 2012-10-27 ENCOUNTER — Encounter (HOSPITAL_COMMUNITY): Payer: Self-pay

## 2012-10-27 ENCOUNTER — Encounter (HOSPITAL_COMMUNITY)
Admission: RE | Admit: 2012-10-27 | Discharge: 2012-10-27 | Disposition: A | Discharge status: Home / Self Care | Payer: Medicare Other | Source: Ambulatory Visit | Attending: Neurological Surgery | Admitting: Neurological Surgery

## 2012-10-27 DIAGNOSIS — Z01810 Encounter for preprocedural cardiovascular examination: Secondary | ICD-10-CM | POA: Insufficient documentation

## 2012-10-27 DIAGNOSIS — I517 Cardiomegaly: Secondary | ICD-10-CM | POA: Insufficient documentation

## 2012-10-27 DIAGNOSIS — Z01812 Encounter for preprocedural laboratory examination: Secondary | ICD-10-CM | POA: Insufficient documentation

## 2012-10-27 DIAGNOSIS — Z01818 Encounter for other preprocedural examination: Secondary | ICD-10-CM | POA: Insufficient documentation

## 2012-10-27 HISTORY — DX: Pneumonia, unspecified organism: J18.9

## 2012-10-27 HISTORY — DX: Anemia, unspecified: D64.9

## 2012-10-27 LAB — CBC
HCT: 39 % (ref 36.0–46.0)
MCH: 29.7 pg (ref 26.0–34.0)
MCV: 87.8 fL (ref 78.0–100.0)
Platelets: 257 10*3/uL (ref 150–400)
RDW: 14.1 % (ref 11.5–15.5)

## 2012-10-27 LAB — BASIC METABOLIC PANEL
CO2: 31 mEq/L (ref 19–32)
Calcium: 9.5 mg/dL (ref 8.4–10.5)
Creatinine, Ser: 0.91 mg/dL (ref 0.50–1.10)
Glucose, Bld: 94 mg/dL (ref 70–99)

## 2012-11-01 MED ORDER — CEFAZOLIN SODIUM-DEXTROSE 2-3 GM-% IV SOLR
2.0000 g | INTRAVENOUS | Status: AC
  Administered 2012-11-02: 2 g via INTRAVENOUS
  Filled 2012-11-01: qty 50

## 2012-11-02 ENCOUNTER — Inpatient Hospital Stay (HOSPITAL_COMMUNITY): Payer: Medicare Other

## 2012-11-02 ENCOUNTER — Encounter (HOSPITAL_COMMUNITY): Payer: Self-pay | Admitting: Anesthesiology

## 2012-11-02 ENCOUNTER — Encounter (HOSPITAL_COMMUNITY)
Admission: RE | Disposition: A | Discharge status: Home / Self Care | Payer: Self-pay | Source: Ambulatory Visit | Attending: Neurological Surgery

## 2012-11-02 ENCOUNTER — Inpatient Hospital Stay (HOSPITAL_COMMUNITY)
Admission: RE | Admit: 2012-11-02 | Discharge: 2012-11-03 | DRG: 473 | Disposition: A | Discharge status: Home / Self Care | Payer: Medicare Other | Source: Ambulatory Visit | Attending: Neurological Surgery | Admitting: Neurological Surgery

## 2012-11-02 ENCOUNTER — Inpatient Hospital Stay (HOSPITAL_COMMUNITY): Payer: Medicare Other | Admitting: Anesthesiology

## 2012-11-02 DIAGNOSIS — J45909 Unspecified asthma, uncomplicated: Secondary | ICD-10-CM | POA: Diagnosis present

## 2012-11-02 DIAGNOSIS — F329 Major depressive disorder, single episode, unspecified: Secondary | ICD-10-CM | POA: Diagnosis present

## 2012-11-02 DIAGNOSIS — K219 Gastro-esophageal reflux disease without esophagitis: Secondary | ICD-10-CM | POA: Diagnosis present

## 2012-11-02 DIAGNOSIS — G8929 Other chronic pain: Secondary | ICD-10-CM | POA: Diagnosis present

## 2012-11-02 DIAGNOSIS — M5010 Cervical disc disorder with radiculopathy, unspecified cervical region: Secondary | ICD-10-CM

## 2012-11-02 DIAGNOSIS — F3289 Other specified depressive episodes: Secondary | ICD-10-CM | POA: Diagnosis present

## 2012-11-02 DIAGNOSIS — IMO0002 Reserved for concepts with insufficient information to code with codable children: Secondary | ICD-10-CM

## 2012-11-02 DIAGNOSIS — I10 Essential (primary) hypertension: Secondary | ICD-10-CM | POA: Diagnosis present

## 2012-11-02 DIAGNOSIS — Z79899 Other long term (current) drug therapy: Secondary | ICD-10-CM

## 2012-11-02 DIAGNOSIS — L408 Other psoriasis: Secondary | ICD-10-CM | POA: Diagnosis present

## 2012-11-02 DIAGNOSIS — M47812 Spondylosis without myelopathy or radiculopathy, cervical region: Principal | ICD-10-CM | POA: Diagnosis present

## 2012-11-02 HISTORY — PX: ANTERIOR CERVICAL DECOMP/DISCECTOMY FUSION: SHX1161

## 2012-11-02 SURGERY — ANTERIOR CERVICAL DECOMPRESSION/DISCECTOMY FUSION 2 LEVELS
Anesthesia: General | Wound class: Clean

## 2012-11-02 MED ORDER — MENTHOL 3 MG MT LOZG
1.0000 | LOZENGE | OROMUCOSAL | Status: DC | PRN
  Administered 2012-11-03: 3 mg via ORAL
  Filled 2012-11-02: qty 9

## 2012-11-02 MED ORDER — METHOCARBAMOL 100 MG/ML IJ SOLN
500.0000 mg | Freq: Four times a day (QID) | INTRAVENOUS | Status: DC | PRN
  Filled 2012-11-02: qty 5

## 2012-11-02 MED ORDER — HEMOSTATIC AGENTS (NO CHARGE) OPTIME
TOPICAL | Status: DC | PRN
  Administered 2012-11-02: 1 via TOPICAL

## 2012-11-02 MED ORDER — SUMATRIPTAN SUCCINATE 100 MG PO TABS
100.0000 mg | ORAL_TABLET | ORAL | Status: DC | PRN
  Filled 2012-11-02: qty 1

## 2012-11-02 MED ORDER — BUPROPION HCL ER (SR) 100 MG PO TB12
100.0000 mg | ORAL_TABLET | Freq: Every day | ORAL | Status: DC
  Filled 2012-11-02: qty 1

## 2012-11-02 MED ORDER — OXYCODONE HCL 5 MG PO TABS: 5.0000 mg | ORAL_TABLET | Freq: Once | ORAL | Status: DC | PRN

## 2012-11-02 MED ORDER — METHOCARBAMOL 500 MG PO TABS: 500.0000 mg | ORAL_TABLET | Freq: Four times a day (QID) | ORAL | Status: DC | PRN

## 2012-11-02 MED ORDER — PHENOL 1.4 % MT LIQD: 1.0000 | OROMUCOSAL | Status: DC | PRN

## 2012-11-02 MED ORDER — LACTATED RINGERS IV SOLN
INTRAVENOUS | Status: DC
  Administered 2012-11-02: 10:00:00 via INTRAVENOUS

## 2012-11-02 MED ORDER — NON FORMULARY: 180.0000 mg | Freq: Every morning | Status: DC

## 2012-11-02 MED ORDER — OXYCODONE HCL 5 MG/5ML PO SOLN: 5.0000 mg | Freq: Once | ORAL | Status: DC | PRN

## 2012-11-02 MED ORDER — 0.9 % SODIUM CHLORIDE (POUR BTL) OPTIME
TOPICAL | Status: DC | PRN
  Administered 2012-11-02: 1000 mL

## 2012-11-02 MED ORDER — SODIUM CHLORIDE 0.9 % IR SOLN
Status: DC | PRN
  Administered 2012-11-02: 12:00:00

## 2012-11-02 MED ORDER — DEXAMETHASONE SODIUM PHOSPHATE 4 MG/ML IJ SOLN
INTRAMUSCULAR | Status: DC | PRN
  Administered 2012-11-02: 4 mg via INTRAVENOUS
  Administered 2012-11-02: 6 mg via INTRAVENOUS

## 2012-11-02 MED ORDER — PAROXETINE HCL 10 MG PO TABS
10.0000 mg | ORAL_TABLET | Freq: Every day | ORAL | Status: DC
  Filled 2012-11-02: qty 1

## 2012-11-02 MED ORDER — LIDOCAINE HCL 4 % MT SOLN
OROMUCOSAL | Status: DC | PRN
  Administered 2012-11-02: 4 mL via TOPICAL

## 2012-11-02 MED ORDER — ROCURONIUM BROMIDE 100 MG/10ML IV SOLN
INTRAVENOUS | Status: DC | PRN
  Administered 2012-11-02: 50 mg via INTRAVENOUS

## 2012-11-02 MED ORDER — ALUM & MAG HYDROXIDE-SIMETH 200-200-20 MG/5ML PO SUSP: 30.0000 mL | Freq: Four times a day (QID) | ORAL | Status: DC | PRN

## 2012-11-02 MED ORDER — PROMETHAZINE HCL 25 MG/ML IJ SOLN
6.2500 mg | INTRAMUSCULAR | Status: DC | PRN
  Administered 2012-11-02: 6.25 mg via INTRAVENOUS

## 2012-11-02 MED ORDER — SODIUM CHLORIDE 0.9 % IV SOLN
INTRAVENOUS | Status: AC
  Filled 2012-11-02: qty 500

## 2012-11-02 MED ORDER — FENTANYL CITRATE 0.05 MG/ML IJ SOLN: 50.0000 ug | Freq: Once | INTRAMUSCULAR | Status: DC

## 2012-11-02 MED ORDER — PHENYLEPHRINE HCL 10 MG/ML IJ SOLN
INTRAMUSCULAR | Status: DC | PRN
  Administered 2012-11-02: 40 ug via INTRAVENOUS

## 2012-11-02 MED ORDER — FLUTICASONE PROPIONATE 50 MCG/ACT NA SUSP
2.0000 | Freq: Every day | NASAL | Status: DC | PRN
  Filled 2012-11-02: qty 16

## 2012-11-02 MED ORDER — MIDAZOLAM HCL 5 MG/5ML IJ SOLN
INTRAMUSCULAR | Status: DC | PRN
  Administered 2012-11-02: 2 mg via INTRAVENOUS

## 2012-11-02 MED ORDER — ALBUTEROL SULFATE HFA 108 (90 BASE) MCG/ACT IN AERS
2.0000 | INHALATION_SPRAY | Freq: Four times a day (QID) | RESPIRATORY_TRACT | Status: DC | PRN
  Filled 2012-11-02: qty 6.7

## 2012-11-02 MED ORDER — SODIUM CHLORIDE 0.9 % IJ SOLN: 3.0000 mL | INTRAMUSCULAR | Status: DC | PRN

## 2012-11-02 MED ORDER — ACETAMINOPHEN 325 MG PO TABS: 650.0000 mg | ORAL_TABLET | ORAL | Status: DC | PRN

## 2012-11-02 MED ORDER — HYDROMORPHONE HCL PF 1 MG/ML IJ SOLN
0.2500 mg | INTRAMUSCULAR | Status: DC | PRN
  Administered 2012-11-02: 0.5 mg via INTRAVENOUS

## 2012-11-02 MED ORDER — CEFAZOLIN SODIUM 1-5 GM-% IV SOLN
1.0000 g | Freq: Three times a day (TID) | INTRAVENOUS | Status: AC
  Administered 2012-11-02 – 2012-11-03 (×2): 1 g via INTRAVENOUS
  Filled 2012-11-02 (×2): qty 50

## 2012-11-02 MED ORDER — PANTOPRAZOLE SODIUM 40 MG PO TBEC
40.0000 mg | DELAYED_RELEASE_TABLET | Freq: Every day | ORAL | Status: DC
  Administered 2012-11-03: 40 mg via ORAL
  Filled 2012-11-02: qty 1

## 2012-11-02 MED ORDER — PROPOFOL 10 MG/ML IV BOLUS
INTRAVENOUS | Status: DC | PRN
  Administered 2012-11-02: 180 mg via INTRAVENOUS

## 2012-11-02 MED ORDER — LOSARTAN POTASSIUM-HCTZ 100-25 MG PO TABS: 1.0000 | ORAL_TABLET | Freq: Every day | ORAL | Status: DC

## 2012-11-02 MED ORDER — MIDAZOLAM HCL 2 MG/2ML IJ SOLN: 1.0000 mg | INTRAMUSCULAR | Status: DC | PRN

## 2012-11-02 MED ORDER — ARTIFICIAL TEARS OP OINT
TOPICAL_OINTMENT | OPHTHALMIC | Status: DC | PRN
  Administered 2012-11-02: 1 via OPHTHALMIC

## 2012-11-02 MED ORDER — SODIUM CHLORIDE 0.9 % IJ SOLN: 3.0000 mL | Freq: Two times a day (BID) | INTRAMUSCULAR | Status: DC

## 2012-11-02 MED ORDER — PROMETHAZINE HCL 25 MG/ML IJ SOLN
INTRAMUSCULAR | Status: AC
  Filled 2012-11-02: qty 1

## 2012-11-02 MED ORDER — HYDROMORPHONE HCL PF 1 MG/ML IJ SOLN
INTRAMUSCULAR | Status: AC
  Filled 2012-11-02: qty 1

## 2012-11-02 MED ORDER — BACITRACIN 50000 UNITS IM SOLR
INTRAMUSCULAR | Status: AC
  Filled 2012-11-02: qty 1

## 2012-11-02 MED ORDER — ONDANSETRON HCL 4 MG/2ML IJ SOLN: 4.0000 mg | INTRAMUSCULAR | Status: DC | PRN

## 2012-11-02 MED ORDER — ONDANSETRON HCL 4 MG/2ML IJ SOLN
INTRAMUSCULAR | Status: DC | PRN
  Administered 2012-11-02: 4 mg via INTRAVENOUS

## 2012-11-02 MED ORDER — POLYETHYLENE GLYCOL 3350 17 G PO PACK
17.0000 g | PACK | Freq: Every day | ORAL | Status: DC | PRN
Start: 2012-11-02 — End: 2012-11-03
  Filled 2012-11-02: qty 1

## 2012-11-02 MED ORDER — HYDROCHLOROTHIAZIDE 25 MG PO TABS
25.0000 mg | ORAL_TABLET | Freq: Every day | ORAL | Status: DC
  Filled 2012-11-02: qty 1

## 2012-11-02 MED ORDER — LIDOCAINE HCL (CARDIAC) 20 MG/ML IV SOLN
INTRAVENOUS | Status: DC | PRN
  Administered 2012-11-02: 100 mg via INTRAVENOUS

## 2012-11-02 MED ORDER — OXYCODONE-ACETAMINOPHEN 5-325 MG PO TABS
1.0000 | ORAL_TABLET | ORAL | Status: DC | PRN
  Filled 2012-11-02: qty 2

## 2012-11-02 MED ORDER — LACTATED RINGERS IV SOLN
INTRAVENOUS | Status: DC | PRN
  Administered 2012-11-02: 11:00:00 via INTRAVENOUS

## 2012-11-02 MED ORDER — MORPHINE SULFATE 2 MG/ML IJ SOLN
1.0000 mg | INTRAMUSCULAR | Status: DC | PRN
  Administered 2012-11-02 – 2012-11-03 (×3): 2 mg via INTRAVENOUS
  Filled 2012-11-02 (×3): qty 1

## 2012-11-02 MED ORDER — FEXOFENADINE HCL 180 MG PO TABS
180.0000 mg | ORAL_TABLET | Freq: Every day | ORAL | Status: DC
  Filled 2012-11-02: qty 1

## 2012-11-02 MED ORDER — FENTANYL CITRATE 0.05 MG/ML IJ SOLN
INTRAMUSCULAR | Status: DC | PRN
  Administered 2012-11-02: 50 ug via INTRAVENOUS
  Administered 2012-11-02 (×2): 100 ug via INTRAVENOUS
  Administered 2012-11-02 (×2): 50 ug via INTRAVENOUS

## 2012-11-02 MED ORDER — ACETAMINOPHEN 650 MG RE SUPP: 650.0000 mg | RECTAL | Status: DC | PRN

## 2012-11-02 MED ORDER — LOSARTAN POTASSIUM 50 MG PO TABS
100.0000 mg | ORAL_TABLET | Freq: Every day | ORAL | Status: DC
  Filled 2012-11-02: qty 2

## 2012-11-02 SURGICAL SUPPLY — 57 items
ADH SKN CLS APL DERMABOND .7 (GAUZE/BANDAGES/DRESSINGS) ×1
BAG DECANTER FOR FLEXI CONT (MISCELLANEOUS) ×2 IMPLANT
BANDAGE GAUZE ELAST BULKY 4 IN (GAUZE/BANDAGES/DRESSINGS) IMPLANT
BIT DRILL 2.3 12 FIXED (INSTRUMENTS) IMPLANT
BIT DRILL NEURO 2X3.1 SFT TUCH (MISCELLANEOUS) ×1 IMPLANT
BUR BARREL STRAIGHT FLUTE 4.0 (BURR) ×2 IMPLANT
CANISTER SUCTION 2500CC (MISCELLANEOUS) ×2 IMPLANT
CLOTH BEACON ORANGE TIMEOUT ST (SAFETY) ×2 IMPLANT
CONT SPEC 4OZ CLIKSEAL STRL BL (MISCELLANEOUS) ×2 IMPLANT
DECANTER SPIKE VIAL GLASS SM (MISCELLANEOUS) ×2 IMPLANT
DERMABOND ADVANCED (GAUZE/BANDAGES/DRESSINGS) ×1
DERMABOND ADVANCED .7 DNX12 (GAUZE/BANDAGES/DRESSINGS) ×1 IMPLANT
DRAPE LAPAROTOMY 100X72 PEDS (DRAPES) ×2 IMPLANT
DRAPE MICROSCOPE LEICA (MISCELLANEOUS) IMPLANT
DRAPE POUCH INSTRU U-SHP 10X18 (DRAPES) ×2 IMPLANT
DRILL 12MM (INSTRUMENTS) ×2
DRILL NEURO 2X3.1 SOFT TOUCH (MISCELLANEOUS) ×2
DURAPREP 6ML APPLICATOR 50/CS (WOUND CARE) ×2 IMPLANT
ELECT REM PT RETURN 9FT ADLT (ELECTROSURGICAL) ×2
ELECTRODE REM PT RTRN 9FT ADLT (ELECTROSURGICAL) ×1 IMPLANT
GAUZE SPONGE 4X4 16PLY XRAY LF (GAUZE/BANDAGES/DRESSINGS) IMPLANT
GLOVE BIOGEL PI IND STRL 7.0 (GLOVE) IMPLANT
GLOVE BIOGEL PI IND STRL 8.5 (GLOVE) ×1 IMPLANT
GLOVE BIOGEL PI INDICATOR 7.0 (GLOVE) ×1
GLOVE BIOGEL PI INDICATOR 8.5 (GLOVE) ×1
GLOVE ECLIPSE 8.5 STRL (GLOVE) ×2 IMPLANT
GLOVE EXAM NITRILE LRG STRL (GLOVE) IMPLANT
GLOVE EXAM NITRILE MD LF STRL (GLOVE) IMPLANT
GLOVE EXAM NITRILE XL STR (GLOVE) IMPLANT
GLOVE EXAM NITRILE XS STR PU (GLOVE) IMPLANT
GLOVE INDICATOR 7.5 STRL GRN (GLOVE) ×1 IMPLANT
GOWN BRE IMP SLV AUR LG STRL (GOWN DISPOSABLE) IMPLANT
GOWN BRE IMP SLV AUR XL STRL (GOWN DISPOSABLE) IMPLANT
GOWN STRL REIN 2XL LVL4 (GOWN DISPOSABLE) ×2 IMPLANT
HEAD HALTER (SOFTGOODS) ×2 IMPLANT
HEMOSTAT POWDER SURGIFOAM 1G (HEMOSTASIS) ×1 IMPLANT
KIT BASIN OR (CUSTOM PROCEDURE TRAY) ×2 IMPLANT
KIT ROOM TURNOVER OR (KITS) ×2 IMPLANT
NDL SPNL 22GX3.5 QUINCKE BK (NEEDLE) ×1 IMPLANT
NEEDLE HYPO 22GX1.5 SAFETY (NEEDLE) ×2 IMPLANT
NEEDLE SPNL 22GX3.5 QUINCKE BK (NEEDLE) ×2 IMPLANT
NS IRRIG 1000ML POUR BTL (IV SOLUTION) ×2 IMPLANT
PACK LAMINECTOMY NEURO (CUSTOM PROCEDURE TRAY) ×2 IMPLANT
PAD ARMBOARD 7.5X6 YLW CONV (MISCELLANEOUS) ×6 IMPLANT
PLATE 32MM (Plate) ×1 IMPLANT
RUBBERBAND STERILE (MISCELLANEOUS) IMPLANT
SCREW 12MM (Screw) ×6 IMPLANT
SPACER ASSEM CERV LORD 6M (Spacer) ×1 IMPLANT
SPACER ASSEM CERV LORD 7M (Spacer) ×1 IMPLANT
SPONGE GAUZE 4X4 12PLY (GAUZE/BANDAGES/DRESSINGS) ×2 IMPLANT
SPONGE INTESTINAL PEANUT (DISPOSABLE) ×2 IMPLANT
SPONGE SURGIFOAM ABS GEL SZ50 (HEMOSTASIS) ×2 IMPLANT
SUT VIC AB 3-0 SH 8-18 (SUTURE) ×4 IMPLANT
SYR 20ML ECCENTRIC (SYRINGE) ×2 IMPLANT
TOWEL OR 17X24 6PK STRL BLUE (TOWEL DISPOSABLE) ×2 IMPLANT
TOWEL OR 17X26 10 PK STRL BLUE (TOWEL DISPOSABLE) ×2 IMPLANT
WATER STERILE IRR 1000ML POUR (IV SOLUTION) ×2 IMPLANT

## 2012-11-02 NOTE — Anesthesia Procedure Notes (Signed)
Procedure Name: Intubation Date/Time: 11/02/2012 12:20 PM Performed by: Orvilla Fus A Pre-anesthesia Checklist: Patient identified, Timeout performed, Emergency Drugs available, Suction available and Patient being monitored Patient Re-evaluated:Patient Re-evaluated prior to inductionOxygen Delivery Method: Circle system utilized Preoxygenation: Pre-oxygenation with 100% oxygen Intubation Type: IV induction Ventilation: Mask ventilation without difficulty Grade View: Grade I Tube type: Oral Tube size: 7.0 mm Airway Equipment and Method: Rigid stylet,  Video-laryngoscopy and LTA kit utilized Placement Confirmation: ETT inserted through vocal cords under direct vision,  breath sounds checked- equal and bilateral and positive ETCO2 Secured at: 21 cm Tube secured with: Tape Dental Injury: Teeth and Oropharynx as per pre-operative assessment

## 2012-11-02 NOTE — Op Note (Signed)
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 Asst.: Karene Fry M.D. Indications: Patient is a 67 year old individual is had significant problems with neck shoulder and left arm pain for some period of time she is used conservative efforts in the past years to treat this process but recently she's had no success and has worsening neck pain with radiation to left shoulder and left arm MRI demonstrates advanced spondylitic changes at both these levels and she is now being advised regarding surgery. Procedure: The patient was brought to the operating room placed on the table in supine position. After the smooth induction of general endotracheal anesthesia neck was placed in 5 pounds of halter traction and prepped with alcohol and DuraPrep. After sterile draping and appropriate timeout procedure a transverse incision was created in the left side of the neck and carried down to the platysma. The plane between the sternocleidomastoid and strap muscles dissected bluntly until the prevertebral space was reached. The first identifiable disc space was noted to be C4-C5 on a localizing radiograph. The dissection was then undertaken in the longus coli muscle to allow placement of a self-retaining Caspar type retractor.  The anterior longitudinal ligament was opened at C5-C6 and ventral osteophytes were removed with a Leksell rongeur and Kerrison punch. Interspace was cleared of significant quantity of the degenerated disc material in the region of the posterior longitudinal ligament was removed. Dissection was carried out using a  high-speed drill and 3-0 Karlin curettes. Uncinate processes were drilled down and removed and osteophytes from the inferior margin of the body of C5 were removed with a Kerrison 2 mm gold punch. After the central canal and lateral recesses were well decompressed hemostasis was achieved with the bipolar cautery and some small pledgets of Gelfoam soaked in thrombin that were later irrigated away.  A 6 mm tall manufactured cortical cancellus allograft was placed into the interspace. C6-C7 Was decompressed and fused in a similar fashion.  Next the retractor was removed and a 32 mm trestle plate was placed over the vertebral bodies and secured with 12 mm variable angle screws. A final localizing radiograph identified the position of the surgical construct. The stasis was achieved in the soft tissues and then the platysma was closed with 3-0 Vicryl in an interrupted fashion and 3-0 Vicryl was used in the subcuticular tissue. Blood loss was estimated at 50 cc.

## 2012-11-02 NOTE — H&P (Signed)
CHIEF COMPLAINT:                                          Neck pain with radiation into right arm.  HISTORY OF PRESENT ILLNESS:                     Kim Ferrell is a 67 year old right-handed individual, who notes that fairly acutely in January of this year, she developed severe pain down the right arm.  She could not lie flat and she could not sleep.  Cervical collar did not make her much more comfortable.  She ultimately had several courses of prednisone orally and then she had some cortisone shot that were given to her and this did seem to make things abate.  Nonetheless, she has been left with persistent pain in the right shoulder, right arm, particularly aggravated with any increase in activity.  She feels pain from the region of the scapula with radiation into the arm and hand.  She notes that she has had some increasing headaches particularly when she gets up in the morning.  This has been persistently problematic and she has been advised that she needs surgery for cervical spine, but requested a visit here after some mutual patients referred her.  She is seen now for further evaluation of her neck.  REVIEW OF SYSTEMS:                                    Her systems review is notable for wearing glasses, sinus problem, sinus headaches, high blood pressure, asthma, chronic cough, history of broken bones in the ankle and toe, back pain, joint pain and swelling, neck pain, depression, difficulty with memory particularly with finding words and some psoriasis.  PAST MEDICAL HISTORY:    Current Medical Conditions:  Notable for some hypertension and some history of acid reflux.  She has also some history of asthma, psoriasis, and arthritis.    Prior Operations:  Previous surgeries include ankle surgery in June a year ago and hysterectomy in the past.    Medications and Allergies:  SHE NOTES AN ALLERGY TO NEOSPORIN, WHICH CAUSES RASH.  Her current medications include paroxetine 10 mg a day for depression,  bupropion for depression, losartan, omeprazole, fexofenadine for allergies, fluticasone for allergies, halobetasol for psoriasis, and calcipotriene cream for psoriasis.  SOCIAL HISTORY:                                            Her personal history reveals that she is a nonsmoker.  She drinks alcohol on a social basis.  Height and weight have been stable at 5\' 6"  and 155 pounds.  PHYSICAL EXAMINATION:                                She is alert and oriented.  Range of motion of her neck allows her to turn 60 degrees to the left and 60 degrees to the right.  She flexes and extends about 80% of normal.  Axial compression does not reproduce pain.  There is some tenderness to palpation in the supraclavicular fossa more so on  the right than on the left.  Motor function reveals that there is weakness in the bicep, wrist extensor, and grip on the right compared to the left.  Tone is intact.  Sensation is slightly diminished in the C6 distribution on the right compared to the left.  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  There is some mild central canal stenosis.  IMPRESSION/PLAN:                                         At this time, I noted that she has fairly good neurologic function.  The main problem is the chronic pain that she is dealing with.  Ultimately, she would be a good candidate for two-level anterior decompression arthrodesis at C5-6 and the C6-7.  I discussed the nature of surgery with her and consideration of surgical intervention.  I discussed with her the major risks of the surgery including difficulties that may be encountered with dealing with the esophagus, potential for nerve root injury and great vessel injury.  All these things now with standing, I believe that she is a good candidate  for surgical intervention.  The decision to proceed with surgery ultimately is hers based on the symptom that she is experiencing.  I noted that surgery generally gives fairly good relief of the symptoms and it should do so in her situation also.  she is admitted for surgery today.

## 2012-11-02 NOTE — Transfer of Care (Signed)
Immediate Anesthesia Transfer of Care Note  Patient: Kim Ferrell  Procedure(s) Performed: Procedure(s) with comments: ANTERIOR CERVICAL DECOMPRESSION/DISCECTOMY FUSION  cervical five-six cervical six-seven (N/A) - Cervical five six  Cervical six-seven Anterior cervical decompression/diskectomy/fusion  Patient Location: PACU  Anesthesia Type:General  Level of Consciousness: awake, alert  and oriented  Airway & Oxygen Therapy: Patient Spontanous Breathing and Patient connected to nasal cannula oxygen  Post-op Assessment: Report given to PACU RN, Post -op Vital signs reviewed and stable and Patient moving all extremities  Post vital signs: Reviewed and stable  Complications: No apparent anesthesia complications

## 2012-11-02 NOTE — Progress Notes (Signed)
Patient ID: Kim Ferrell, female   DOB: 1945/06/27, 67 y.o.   MRN: [id number] Post op note Awake alert Neuro intact Dressing Dry.  Stable post op.

## 2012-11-02 NOTE — Anesthesia Preprocedure Evaluation (Signed)
Anesthesia Evaluation  Patient identified by MRN, date of birth, ID band Patient awake    Reviewed: Allergy & Precautions, H&P , NPO status , Patient's Chart, lab work & pertinent test results  Airway Mallampati: I TM Distance: >3 FB Neck ROM: Full    Dental   Pulmonary asthma ,  breath sounds clear to auscultation        Cardiovascular hypertension, Rhythm:Regular     Neuro/Psych  Headaches, Depression    GI/Hepatic GERD-  ,  Endo/Other    Renal/GU      Musculoskeletal   Abdominal   Peds  Hematology   Anesthesia Other Findings   Reproductive/Obstetrics                           Anesthesia Physical Anesthesia Plan  ASA: II  Anesthesia Plan: General   Post-op Pain Management:    Induction: Intravenous  Airway Management Planned: Oral ETT  Additional Equipment:   Intra-op Plan:   Post-operative Plan: Extubation in OR  Informed Consent: I have reviewed the patients History and Physical, chart, labs and discussed the procedure including the risks, benefits and alternatives for the proposed anesthesia with the patient or authorized representative who has indicated his/her understanding and acceptance.     Plan Discussed with: CRNA and Surgeon  Anesthesia Plan Comments:         Anesthesia Quick Evaluation

## 2012-11-02 NOTE — Plan of Care (Signed)
Problem: Consults Goal: Diagnosis - Spinal Surgery Outcome: Completed/Met Date Met:  11/02/12 Cervical Spine Fusion

## 2012-11-02 NOTE — Anesthesia Postprocedure Evaluation (Signed)
  Anesthesia Post-op Note  Patient: Kim Ferrell  Procedure(s) Performed: Procedure(s) with comments: ANTERIOR CERVICAL DECOMPRESSION/DISCECTOMY FUSION  cervical five-six cervical six-seven (N/A) - Cervical five six  Cervical six-seven Anterior cervical decompression/diskectomy/fusion  Patient Location: PACU  Anesthesia Type:General  Level of Consciousness: awake  Airway and Oxygen Therapy: Patient Spontanous Breathing  Post-op Pain: mild  Post-op Assessment: Post-op Vital signs reviewed, Patient's Cardiovascular Status Stable, Respiratory Function Stable, Patent Airway, No signs of Nausea or vomiting and Pain level controlled  Post-op Vital Signs: stable  Complications: No apparent anesthesia complications

## 2012-11-02 NOTE — Preoperative (Signed)
Beta Blockers   Reason not to administer Beta Blockers:Not Applicable 

## 2012-11-03 ENCOUNTER — Encounter (HOSPITAL_COMMUNITY): Payer: Self-pay | Admitting: Neurological Surgery

## 2012-11-03 MED ORDER — HYDROCODONE-ACETAMINOPHEN 5-325 MG PO TABS
1.0000 | ORAL_TABLET | ORAL | Status: DC | PRN
  Administered 2012-11-03 (×2): 1 via ORAL
  Filled 2012-11-03 (×2): qty 1

## 2012-11-03 MED ORDER — DIAZEPAM 5 MG PO TABS: 5.0000 mg | ORAL_TABLET | Freq: Four times a day (QID) | ORAL | Status: DC | PRN

## 2012-11-03 NOTE — Progress Notes (Signed)
Pt. discharged home accompanied by husband. Prescriptions and discharge instructions given with verbalization of understanding. Incision site on neck with no s/s of infection - no swelling, redness, bleeding, and/or drainage noted. Opportunity given to ask questions but no question asked. Pt. transported out of this unit in wheelchair by the volunteer  

## 2012-11-03 NOTE — Discharge Summary (Signed)
Physician Discharge Summary  Patient ID: Kim Ferrell MRN: [id number] DOB/AGE: 1945-04-23 66 y.o.  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 *   Discharged Condition: good  Hospital Course: patien had uncomplicated surgery  Consults: None  Significant Diagnostic Studies: none  Treatments: acd C5-6 C6-7  Discharge Exam: Blood pressure 133/75, pulse 74, temperature 98.5 F (36.9 C), temperature source Oral, resp. rate 20, last menstrual period 04/02/1987, SpO2 94.00%. incision clean and dry . Motor ok  Disposition: 01-Home or Self Care  Discharge Orders   Future Orders Complete By Expires     Call MD for:  redness, tenderness, or signs of infection (pain, swelling, redness, odor or green/yellow discharge around incision site)  As directed     Call MD for:  severe uncontrolled pain  As directed     Call MD for:  temperature >100.4  As directed     Diet - low sodium heart healthy  As directed     Discharge instructions  As directed     Comments:      Okay to shower. Do not apply salves or appointments to incision. No heavy lifting with the upper extremities greater than 15 pounds. May resume driving when not requiring pain medication and patient feels comfortable with doing so.    Increase activity slowly  As directed         Medication List         albuterol 108 (90 BASE) MCG/ACT inhaler  Commonly known as:  PROVENTIL HFA;VENTOLIN HFA  Inhale 2 puffs into the lungs every 6 (six) hours as needed for shortness of breath.     buPROPion 100 MG 12 hr tablet  Commonly known as:  WELLBUTRIN SR  Take 100 mg by mouth daily.     calcipotriene 0.005 % cream  Commonly known as:  DOVONOX  Apply 1 application topically daily.     clobetasol cream 0.05 %  Commonly known as:   TEMOVATE  Apply 1 application topically daily as needed (Psoriasis).     diazepam 5 MG tablet  Commonly known as:  VALIUM  Take 1 tablet (5 mg total) by mouth every 6 (six) hours as needed (muscle spasms).     estradiol 0.1 MG/GM vaginal cream  Commonly known as:  ESTRACE  Place 2 g vaginally once a week.     fexofenadine 180 MG tablet  Commonly known as:  ALLEGRA  Take 180 mg by mouth daily.     fluticasone 50 MCG/ACT nasal spray  Commonly known as:  FLONASE  Place 2 sprays into the nose daily as needed for rhinitis.     halobetasol 0.05 % cream  Commonly known as:  ULTRAVATE  Apply 1 application topically daily.     losartan-hydrochlorothiazide 100-25 MG per tablet  Commonly known as:  HYZAAR  Take 1 tablet by mouth daily.     methotrexate 2.5 MG tablet  Commonly known as:  RHEUMATREX  Take 12.5 mg by mouth once a week. Caution:Chemotherapy. Protect from light. Take on Tuesdays.     omeprazole 20 MG capsule  Commonly known as:  PRILOSEC  Take 20 mg by mouth 2 (two) times daily.     PARoxetine 10 MG tablet  Commonly known as:  PAXIL  Take 10 mg by mouth every morning.     polyethylene glycol packet  Commonly known as:  MIRALAX / GLYCOLAX  Take 17 g by mouth daily as needed (Constipation).     predniSONE 5 MG Tabs tablet  Commonly known as:  STERAPRED UNI-PAK  Take by mouth. Decreasing dose     SUMAtriptan 100 MG tablet  Commonly known as:  IMITREX  Take 100 mg by mouth every 2 (two) hours as needed for migraine. For headache         Signed: Stefani Dama 11/03/2012, 11:38 AM

## 2012-11-03 NOTE — Progress Notes (Signed)
UR COMPLETED  

## 2012-11-20 ENCOUNTER — Other Ambulatory Visit (INDEPENDENT_AMBULATORY_CARE_PROVIDER_SITE_OTHER): Payer: Medicare Other | Admitting: *Deleted

## 2012-11-20 DIAGNOSIS — R42 Dizziness and giddiness: Secondary | ICD-10-CM

## 2012-11-20 DIAGNOSIS — I10 Essential (primary) hypertension: Secondary | ICD-10-CM

## 2012-11-23 ENCOUNTER — Encounter: Payer: Self-pay | Admitting: Internal Medicine

## 2013-02-02 ENCOUNTER — Encounter: Payer: Self-pay | Admitting: Gynecology

## 2014-01-18 ENCOUNTER — Ambulatory Visit (INDEPENDENT_AMBULATORY_CARE_PROVIDER_SITE_OTHER): Payer: Medicare Other | Admitting: Gynecology

## 2014-01-18 ENCOUNTER — Encounter: Payer: Self-pay | Admitting: Gynecology

## 2014-01-18 DIAGNOSIS — L900 Lichen sclerosus et atrophicus: Secondary | ICD-10-CM

## 2014-01-18 MED ORDER — ESTRADIOL 0.1 MG/GM VA CREA: 2.0000 g | TOPICAL_CREAM | VAGINAL | Status: DC

## 2014-01-18 MED ORDER — CLOBETASOL PROPIONATE 0.05 % EX CREA: 1.0000 "application " | TOPICAL_CREAM | Freq: Every evening | CUTANEOUS | Status: DC

## 2014-01-18 NOTE — Progress Notes (Signed)
Kim Ferrell 11/23/1945 [id number]        68 y.o.  G2P2002 With no history of lichen sclerosus and atrophic vaginal changes presents complaining of worsening vaginal irritation. Her husband looked and said it looked bloody. Has been using her estrogen cream although admits to just once weekly at best and using the Temovate cream once weekly.  Past medical history,surgical history, problem list, medications, allergies, family history and social history were all reviewed and documented in the EPIC chart.  Directed ROS with pertinent positives and negatives documented in the history of present illness/assessment and plan.  Exam: Kim assistant General appearance:  Normal External BUS vagina with extreme atrophic changes and skin cracking and bruising around the introital opening. White blanched mucosa from the periclitoral hood to the perianal region consistent with her history of lichen sclerosis. Vagina atrophic. Bimanual without masses or tenderness  Assessment/Plan:  68 y.o. [id number] with significant atrophic and lichen sclerosus changes. Not using treatment creams aggressively enough. Recommend the Estrace vaginal cream 3 times weekly and to stay on this for now and the Temovate cream nightly and then follow up in 6 weeks for reexamination.     Kim Ferrell,Kim Gause P MD, 11:08 AM 01/18/2014

## 2014-01-18 NOTE — Patient Instructions (Signed)
Use the estrogen cream vaginally 3 times weekly Use the Temovate steroid vaginal cream nightly Follow up in 6 weeks for reexamination

## 2014-01-19 LAB — URINALYSIS W MICROSCOPIC + REFLEX CULTURE
BACTERIA UA: NONE SEEN
BILIRUBIN URINE: NEGATIVE
CASTS: NONE SEEN
CRYSTALS: NONE SEEN
GLUCOSE, UA: NEGATIVE mg/dL
Hgb urine dipstick: NEGATIVE
KETONES UR: NEGATIVE mg/dL
Nitrite: NEGATIVE
PH: 7 (ref 5.0–8.0)
Protein, ur: NEGATIVE mg/dL
SPECIFIC GRAVITY, URINE: 1.017 (ref 1.005–1.030)
Squamous Epithelial / LPF: NONE SEEN
Urobilinogen, UA: 0.2 mg/dL (ref 0.0–1.0)

## 2014-01-20 LAB — URINE CULTURE: Colony Count: 80000

## 2014-01-31 ENCOUNTER — Encounter: Payer: Self-pay | Admitting: Gynecology

## 2014-03-03 ENCOUNTER — Ambulatory Visit (INDEPENDENT_AMBULATORY_CARE_PROVIDER_SITE_OTHER): Payer: Medicare Other | Admitting: Gynecology

## 2014-03-03 ENCOUNTER — Encounter: Payer: Self-pay | Admitting: Gynecology

## 2014-03-03 VITALS — BP 120/74 | Ht 65.5 in | Wt 159.0 lb

## 2014-03-03 DIAGNOSIS — N952 Postmenopausal atrophic vaginitis: Secondary | ICD-10-CM

## 2014-03-03 DIAGNOSIS — L900 Lichen sclerosus et atrophicus: Secondary | ICD-10-CM

## 2014-03-03 MED ORDER — ESTRADIOL 0.1 MG/GM VA CREA: 2.0000 g | TOPICAL_CREAM | VAGINAL | Status: DC

## 2014-03-03 NOTE — Progress Notes (Signed)
Kim EconomyJane Ferrell 09-12-1945 [id number]        68 y.o.  G2P2002 for follow up exam.  Past medical history,surgical history, problem list, medications, allergies, family history and social history were all reviewed and documented as reviewed in the EPIC chart.  ROS:  12 system ROS performed with pertinent positives and negatives included in the history, assessment and plan.   Additional significant findings :  none   Exam: Kim Ferrell Filed Vitals:   03/03/14 1508  BP: 120/74  Height: 5' 5.5" (1.664 m)  Weight: 159 lb (72.122 kg)   General appearance:  Normal affect, orientation and appearance. Skin: Grossly normal HEENT: Without gross lesions.  No cervical or supraclavicular adenopathy. Thyroid normal.  Lungs:  Clear without wheezing, rales or rhonchi Cardiac: RR, without RMG Abdominal:  Soft, nontender, without masses, guarding, rebound, organomegaly or hernia Breasts:  Examined lying and sitting without masses, retractions, discharge or axillary adenopathy. Pelvic:  Ext/BUS/vagina with generalized atrophic changes. Lichen sclerosis blanching from clitoral hood the perianal region. Prior skin cracking and abrasion areas all healed.  Adnexa  Without masses or tenderness    Anus and perineum  Normal   Rectovaginal  Normal sphincter tone without palpated masses or tenderness.    Assessment/Plan:  68 y.o. [id number] female for follow up exam.   1. Atrophic vaginitis/lichen sclerosus. Patient has been using the estrogen cream twice weekly and the Temovate nightly. Reports much improvement in her symptoms. Exam does show baseline lichen sclerosus changes but no inflammatory irritative skin changes.  Will continue on the Estrace cream twice weekly. Issues of absorption and systemic effects such as stroke heart attack DVT breast cancer discussed. Will wean over the next month from the Temovate and use intermittently as needed for symptoms. 2. Postmenopausal. Status post TAH for  leiomyoma/bleeding.  Without significant hot flushes, night sweats or other symptoms. Will continue to monitor. 3. Pap smear reported several years ago. No history of abnormal Pap smears previously. No Pap smear done today. Reviewed current screening guidelines. Status post hysterectomy for benign indications. Over the age of 68. We both agree to stop screening and she is comfortable with this. 4. Mammography due now and patient knows to schedule. SBE monthly reviewed. 5. DEXA 2011 with AP spine normal. Did not see where hip was done. Repeat next year at five-year interval. Increased calcium vitamin D reviewed. 6. Colonoscopy less than 10 years. Reported repeat interval 10 years. 7. Health maintenance. No routine blood work done as this is done at her primary physician's office. Follow up 1 year, sooner if any issues.     Dara LordsFONTAINE,[name] P MD, 3:30 PM 03/03/2014

## 2014-03-03 NOTE — Patient Instructions (Signed)
Continue with the estrogen cream twice daily. Wean off of the Temovate cream and use it intermittently as needed for symptoms.   You may obtain a copy of any labs that were done today by logging onto MyChart as outlined in the instructions provided with your AVS (after visit summary). The office will not call with normal lab results but certainly if there are any significant abnormalities then we will contact you.   Health Maintenance, Female A healthy lifestyle and preventative care can promote health and wellness.  Maintain regular health, dental, and eye exams.  Eat a healthy diet. Foods like vegetables, fruits, whole grains, low-fat dairy products, and lean protein foods contain the nutrients you need without too many calories. Decrease your intake of foods high in solid fats, added sugars, and salt. Get information about a proper diet from your caregiver, if necessary.  Regular physical exercise is one of the most important things you can do for your health. Most adults should get at least 150 minutes of moderate-intensity exercise (any activity that increases your heart rate and causes you to sweat) each week. In addition, most adults need muscle-strengthening exercises on 2 or more days a week.   Maintain a healthy weight. The body mass index (BMI) is a screening tool to identify possible weight problems. It provides an estimate of body fat based on height and weight. Your caregiver can help determine your BMI, and can help you achieve or maintain a healthy weight. For adults 20 years and older:  A BMI below 18.5 is considered underweight.  A BMI of 18.5 to 24.9 is normal.  A BMI of 25 to 29.9 is considered overweight.  A BMI of 30 and above is considered obese.  Maintain normal blood lipids and cholesterol by exercising and minimizing your intake of saturated fat. Eat a balanced diet with plenty of fruits and vegetables. Blood tests for lipids and cholesterol should begin at age 57  and be repeated every 5 years. If your lipid or cholesterol levels are high, you are over 50, or you are a high risk for heart disease, you may need your cholesterol levels checked more frequently.Ongoing high lipid and cholesterol levels should be treated with medicines if diet and exercise are not effective.  If you smoke, find out from your caregiver how to quit. If you do not use tobacco, do not start.  Lung cancer screening is recommended for adults aged 66 80 years who are at high risk for developing lung cancer because of a history of smoking. Yearly low-dose computed tomography (CT) is recommended for people who have at least a 30-pack-year history of smoking and are a current smoker or have quit within the past 15 years. A pack year of smoking is smoking an average of 1 pack of cigarettes a day for 1 year (for example: 1 pack a day for 30 years or 2 packs a day for 15 years). Yearly screening should continue until the smoker has stopped smoking for at least 15 years. Yearly screening should also be stopped for people who develop a health problem that would prevent them from having lung cancer treatment.  If you are pregnant, do not drink alcohol. If you are breastfeeding, be very cautious about drinking alcohol. If you are not pregnant and choose to drink alcohol, do not exceed 1 drink per day. One drink is considered to be 12 ounces (355 mL) of beer, 5 ounces (148 mL) of wine, or 1.5 ounces (44 mL) of liquor.  Avoid use of street drugs. Do not share needles with anyone. Ask for help if you need support or instructions about stopping the use of drugs.  High blood pressure causes heart disease and increases the risk of stroke. Blood pressure should be checked at least every 1 to 2 years. Ongoing high blood pressure should be treated with medicines, if weight loss and exercise are not effective.  If you are 36 to 69 years old, ask your caregiver if you should take aspirin to prevent  strokes.  Diabetes screening involves taking a blood sample to check your fasting blood sugar level. This should be done once every 3 years, after age 30, if you are within normal weight and without risk factors for diabetes. Testing should be considered at a younger age or be carried out more frequently if you are overweight and have at least 1 risk factor for diabetes.  Breast cancer screening is essential preventative care for women. You should practice "breast self-awareness." This means understanding the normal appearance and feel of your breasts and may include breast self-examination. Any changes detected, no matter how small, should be reported to a caregiver. Women in their 63s and 30s should have a clinical breast exam (CBE) by a caregiver as part of a regular health exam every 1 to 3 years. After age 46, women should have a CBE every year. Starting at age 41, women should consider having a mammogram (breast X-ray) every year. Women who have a family history of breast cancer should talk to their caregiver about genetic screening. Women at a high risk of breast cancer should talk to their caregiver about having an MRI and a mammogram every year.  Breast cancer gene (BRCA)-related cancer risk assessment is recommended for women who have family members with BRCA-related cancers. BRCA-related cancers include breast, ovarian, tubal, and peritoneal cancers. Having family members with these cancers may be associated with an increased risk for harmful changes (mutations) in the breast cancer genes BRCA1 and BRCA2. Results of the assessment will determine the need for genetic counseling and BRCA1 and BRCA2 testing.  The Pap test is a screening test for cervical cancer. Women should have a Pap test starting at age 64. Between ages 70 and 95, Pap tests should be repeated every 2 years. Beginning at age 71, you should have a Pap test every 3 years as long as the past 3 Pap tests have been normal. If you had a  hysterectomy for a problem that was not cancer or a condition that could lead to cancer, then you no longer need Pap tests. If you are between ages 56 and 49, and you have had normal Pap tests going back 10 years, you no longer need Pap tests. If you have had past treatment for cervical cancer or a condition that could lead to cancer, you need Pap tests and screening for cancer for at least 20 years after your treatment. If Pap tests have been discontinued, risk factors (such as a new sexual partner) need to be reassessed to determine if screening should be resumed. Some women have medical problems that increase the chance of getting cervical cancer. In these cases, your caregiver may recommend more frequent screening and Pap tests.  The human papillomavirus (HPV) test is an additional test that may be used for cervical cancer screening. The HPV test looks for the virus that can cause the cell changes on the cervix. The cells collected during the Pap test can be tested for HPV. The  HPV test could be used to screen women aged 60 years and older, and should be used in women of any age who have unclear Pap test results. After the age of 62, women should have HPV testing at the same frequency as a Pap test.  Colorectal cancer can be detected and often prevented. Most routine colorectal cancer screening begins at the age of 55 and continues through age 35. However, your caregiver may recommend screening at an earlier age if you have risk factors for colon cancer. On a yearly basis, your caregiver may provide home test kits to check for hidden blood in the stool. Use of a small camera at the end of a tube, to directly examine the colon (sigmoidoscopy or colonoscopy), can detect the earliest forms of colorectal cancer. Talk to your caregiver about this at age 74, when routine screening begins. Direct examination of the colon should be repeated every 5 to 10 years through age 34, unless early forms of pre-cancerous  polyps or small growths are found.  Hepatitis C blood testing is recommended for all people born from [birth year] through 1966 and any individual with known risks for hepatitis C.  Practice safe sex. Use condoms and avoid high-risk sexual practices to reduce the spread of sexually transmitted infections (STIs). Sexually active women aged 39 and younger should be checked for Chlamydia, which is a common sexually transmitted infection. Older women with new or multiple partners should also be tested for Chlamydia. Testing for other STIs is recommended if you are sexually active and at increased risk.  Osteoporosis is a disease in which the bones lose minerals and strength with aging. This can result in serious bone fractures. The risk of osteoporosis can be identified using a bone density scan. Women ages 53 and over and women at risk for fractures or osteoporosis should discuss screening with their caregivers. Ask your caregiver whether you should be taking a calcium supplement or vitamin D to reduce the rate of osteoporosis.  Menopause can be associated with physical symptoms and risks. Hormone replacement therapy is available to decrease symptoms and risks. You should talk to your caregiver about whether hormone replacement therapy is right for you.  Use sunscreen. Apply sunscreen liberally and repeatedly throughout the day. You should seek shade when your shadow is shorter than you. Protect yourself by wearing long sleeves, pants, a wide-brimmed hat, and sunglasses year round, whenever you are outdoors.  Notify your caregiver of new moles or changes in moles, especially if there is a change in shape or color. Also notify your caregiver if a mole is larger than the size of a pencil eraser.  Stay current with your immunizations. Document Released: 10/01/2010 Document Revised: 07/13/2012 Document Reviewed: 10/01/2010 [facility] Patient Information 2014 [place].

## 2014-03-04 LAB — URINALYSIS W MICROSCOPIC + REFLEX CULTURE
BACTERIA UA: NONE SEEN
BILIRUBIN URINE: NEGATIVE
CASTS: NONE SEEN
CRYSTALS: NONE SEEN
Glucose, UA: NEGATIVE mg/dL
Hgb urine dipstick: NEGATIVE
KETONES UR: NEGATIVE mg/dL
Leukocytes, UA: NEGATIVE
NITRITE: NEGATIVE
PH: 6 (ref 5.0–8.0)
Protein, ur: NEGATIVE mg/dL
SPECIFIC GRAVITY, URINE: 1.009 (ref 1.005–1.030)
SQUAMOUS EPITHELIAL / LPF: NONE SEEN
Urobilinogen, UA: 0.2 mg/dL (ref 0.0–1.0)

## 2014-03-08 ENCOUNTER — Encounter: Payer: Self-pay | Admitting: Gynecology

## 2014-10-04 DIAGNOSIS — J3802 Paralysis of vocal cords and larynx, bilateral: Secondary | ICD-10-CM | POA: Insufficient documentation

## 2014-10-04 DIAGNOSIS — R490 Dysphonia: Secondary | ICD-10-CM | POA: Insufficient documentation

## 2014-10-04 DIAGNOSIS — J385 Laryngeal spasm: Secondary | ICD-10-CM | POA: Insufficient documentation

## 2014-10-04 DIAGNOSIS — J383 Other diseases of vocal cords: Secondary | ICD-10-CM | POA: Insufficient documentation

## 2014-10-24 ENCOUNTER — Telehealth: Payer: Self-pay | Admitting: Internal Medicine

## 2014-10-24 NOTE — Telephone Encounter (Signed)
Notified pt. She will pursue a different physician.

## 2014-10-24 NOTE — Telephone Encounter (Signed)
Relation to pt: self  °Call back number: [phone] ° ° °Reason for call:  °Patient would like to schedule new patient appointment referred by Metz, Susan A mrn# [phone]  °

## 2014-10-24 NOTE — Telephone Encounter (Signed)
Unfortunately, I am not taking new pt's at this time due my very large pt panel and the fact that I will be opening a new office. 

## 2014-11-03 ENCOUNTER — Telehealth: Payer: Self-pay | Admitting: Family Medicine

## 2014-11-03 NOTE — Telephone Encounter (Signed)
Pt calling again this time stating that Dr. Esther Hardy also referred her to Dr. Beverely Low. She said that she is aware she is moving to [place] and is willing to go to that practice as well if Dr. Beverely Low would be kind enough to accept her and her husband as pts. Please advise.

## 2014-11-22 NOTE — Telephone Encounter (Signed)
Ok for pt to establish 

## 2014-11-23 NOTE — Telephone Encounter (Signed)
Lm for pt to call and schedule new pt appt °

## 2015-03-20 ENCOUNTER — Encounter: Payer: Self-pay | Admitting: Gynecology

## 2015-08-29 ENCOUNTER — Ambulatory Visit (INDEPENDENT_AMBULATORY_CARE_PROVIDER_SITE_OTHER): Payer: Medicare Other | Admitting: Gynecology

## 2015-08-29 ENCOUNTER — Encounter: Payer: Self-pay | Admitting: Gynecology

## 2015-08-29 VITALS — BP 118/76

## 2015-08-29 DIAGNOSIS — L900 Lichen sclerosus et atrophicus: Secondary | ICD-10-CM | POA: Diagnosis not present

## 2015-08-29 MED ORDER — CLOBETASOL PROPIONATE 0.05 % EX CREA: 1.0000 "application " | TOPICAL_CREAM | Freq: Every evening | CUTANEOUS | Status: DC

## 2015-08-29 MED ORDER — ESTRADIOL 0.1 MG/GM VA CREA: 2.0000 g | TOPICAL_CREAM | VAGINAL | Status: DC

## 2015-08-29 NOTE — Progress Notes (Signed)
    Jule EconomyJane Youngblood 1945-11-07 [id number]        70 y.o.  G2P2002 presents with a known history of lichen sclerosis using Temovate cream intermittently and Estrace vaginal cream 2-3 times weekly to control. Admits to not having used the Temovate or the Estrace on a regular basis. Is over the last several months having worsening vulvar irritation to the point of cracking and bleeding. History of TAH in the past.  Past medical history,surgical history, problem list, medications, allergies, family history and social history were all reviewed and documented in the EPIC chart.  Directed ROS with pertinent positives and negatives documented in the history of present illness/assessment and plan.  Exam: Kennon PortelaKim Gardner assistant Filed Vitals:   08/29/15 1531  BP: 118/76   General appearance:  Normal Abdomen soft nontender without masses guarding rebound Pelvic external BUS vagina with symmetrical blanching of the skin from periclitoral to perianal region. Skin cracking along the junction of the labia minora to the labia majora on the right. Vagina without lesions. Bimanual without masses or tenderness  Assessment/Plan:  70 y.o. [id number] with recent flare of lichen sclerosis. Has not been using her medications routinely.We'll start with her clobetasol nightly for a month and then wean from there. Use Estrace vaginal cream 3 times weekly for the next month and then wean to twice weekly. Follow up for annual exam as she is overdue.    Dara LordsFONTAINE,Tayshun Gappa P MD, 3:59 PM 08/29/2015

## 2015-08-29 NOTE — Patient Instructions (Signed)
Use the clobetasol cream nightly for a month and then every other night for another several weeks. Use the estrogen vaginal cream 3 times weekly. Follow up for scheduled annual exam appointment.

## 2015-09-11 ENCOUNTER — Telehealth: Payer: Self-pay | Admitting: *Deleted

## 2015-09-11 NOTE — Telephone Encounter (Signed)
Continue with the clobetasol cream nightly for another month and then wean.

## 2015-09-11 NOTE — Telephone Encounter (Signed)
Unable to relay message to pt home # not voicemail can be left at and cell is unavailable.

## 2015-09-11 NOTE — Telephone Encounter (Signed)
Pt called to follow up with office visit 08/29/15 was prescribed clobetasol nightly for a month and then wean from there. Use Estrace vaginal cream 3 times weekly for the next month and then wean to twice weekly. Pt said the clobetasol is helping, but has no yet cleared lichen sclerosis yet. Pt stopped the estrace cream states it caused hot flashes, I explained to pt it may take more time to fully clear. Pt c/o itching and irration. Please advise

## 2015-09-12 ENCOUNTER — Encounter: Payer: Self-pay | Admitting: *Deleted

## 2015-09-12 NOTE — Telephone Encounter (Signed)
Okay for letter to state that patient has a medical condition which prohibited sitting for long periods of time

## 2015-09-12 NOTE — Telephone Encounter (Signed)
Pt informed with the below note, pt also wanted me to check with you if you would be willing to type up a letter was schedule to go on trip Monday for [place] which is a 10 hour flight, canceled due to the lichen sclerosis itching and discomfort. Pt knew she won't be able to fly for that long period of time. Pt would be able for refund if a medical letter can be presented. If you approve this letter I will type it, if you would just let me know what you would like it to say. Please advise

## 2015-09-12 NOTE — Telephone Encounter (Signed)
Pt informed with the below note, letter printed and left at front desk for pt to pick up.

## 2015-09-14 NOTE — Telephone Encounter (Signed)
Pt walked in office with a form that needed to filled out and fax to the refund department, form was filled out, signed by TF and faxed to [employer] at [phone]. Pt aware and original copy left at front desk for pick up.

## 2015-11-08 ENCOUNTER — Encounter: Payer: Self-pay | Admitting: Gynecology

## 2015-11-08 ENCOUNTER — Ambulatory Visit (INDEPENDENT_AMBULATORY_CARE_PROVIDER_SITE_OTHER): Payer: Medicare Other | Admitting: Gynecology

## 2015-11-08 VITALS — BP 124/76 | Ht 65.0 in | Wt 160.0 lb

## 2015-11-08 DIAGNOSIS — Z01419 Encounter for gynecological examination (general) (routine) without abnormal findings: Secondary | ICD-10-CM | POA: Diagnosis not present

## 2015-11-08 DIAGNOSIS — L900 Lichen sclerosus et atrophicus: Secondary | ICD-10-CM

## 2015-11-08 DIAGNOSIS — N952 Postmenopausal atrophic vaginitis: Secondary | ICD-10-CM

## 2015-11-08 MED ORDER — ESTRADIOL 0.1 MG/GM VA CREA: 2.0000 g | TOPICAL_CREAM | VAGINAL | 2 refills | Status: DC

## 2015-11-08 MED ORDER — CLOBETASOL PROPIONATE 0.05 % EX CREA: 1.0000 "application " | TOPICAL_CREAM | Freq: Every evening | CUTANEOUS | 1 refills | Status: DC

## 2015-11-08 NOTE — Patient Instructions (Signed)

## 2015-11-08 NOTE — Progress Notes (Signed)
    Jule EconomyJane Shindler 04-15-1945 [id number]        70 y.o.  G2P2002  for breast and pelvic exam  Past medical history,surgical history, problem list, medications, allergies, family history and social history were all reviewed and documented as reviewed in the EPIC chart.  ROS:  Performed with pertinent positives and negatives included in the history, assessment and plan.   Additional significant findings :  None   Exam: Kennon PortelaKim Gardner assistant Vitals:   11/08/15 1039  BP: 124/76  Weight: 160 lb (72.6 kg)  Height: 5\' 5"  (1.651 m)   Body mass index is 26.63 kg/m.  General appearance:  Normal affect, orientation and appearance. Skin: Grossly normal HEENT: Without gross lesions.  No cervical or supraclavicular adenopathy. Thyroid normal.  Lungs:  Clear without wheezing, rales or rhonchi Cardiac: RR, without RMG Abdominal:  Soft, nontender, without masses, guarding, rebound, organomegaly or hernia Breasts:  Examined lying and sitting without masses, retractions, discharge or axillary adenopathy. Pelvic:  Ext/BUS/Vagina with generalized atrophic changes.  Blanching of the skin and symmetrical from the clitoral hood to the perianal region consistent with her diagnosis of lichen sclerosis.  Adnexa without masses or tenderness    Anus and perineum normal   Rectovaginal normal sphincter tone without palpated masses or tenderness.    Assessment/Plan:  70 y.o. [id number] female for breast and pelvic exam.   1. Lichen sclerosis. 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 Patient doing much better now that she is using clobetasol and her Estrace cream consistently. With discussed the issues of absorption and systemic risks which she is comfortable with. Uses the to motivate intermittently as needed and the Estrace twice weekly. Refill 1 year provided. 2. Postmenopausal. Without significant hot flushes or sweats. Continue to monitor report any issues. 3. Pap smear several years ago. No  Pap smear done today. Per current screening guidelines we both agree to stop screening with no history of abnormal Pap smears, status post hysterectomy for benign indications and over the age 70. 4. DEXA recently this year. Patient reports stable osteopenia for which her primary physician is following. Continue to follow up with them in reference to this. 5. Mammography 03/2015. Continue with annual mammography when due. SBE monthly reviewed. 6. Colonoscopy reported within 10 years. Follow up for their recommendations. 7. Health maintenance. No routine lab work done as patient reports is done elsewhere. Follow up 1 year, sooner as needed.  Dara LordsFONTAINE,Domonique Brouillard P MD, 10:58 AM 11/08/2015

## 2016-05-01 ENCOUNTER — Encounter: Payer: Self-pay | Admitting: Gynecology

## 2016-05-09 ENCOUNTER — Telehealth: Payer: Self-pay | Admitting: *Deleted

## 2016-05-09 NOTE — Telephone Encounter (Signed)
Pt left message in triage voicemail asking if flu shot was given at [facility] on 11/08/15. I called and received patient voicemail I left she did not have done here.

## 2016-07-18 ENCOUNTER — Encounter: Payer: Self-pay | Admitting: Gynecology

## 2016-07-18 ENCOUNTER — Ambulatory Visit (INDEPENDENT_AMBULATORY_CARE_PROVIDER_SITE_OTHER): Payer: Medicare Other | Admitting: Gynecology

## 2016-07-18 VITALS — BP 124/76

## 2016-07-18 DIAGNOSIS — L900 Lichen sclerosus et atrophicus: Secondary | ICD-10-CM | POA: Diagnosis not present

## 2016-07-18 NOTE — Progress Notes (Signed)
    Kim Ferrell 1945/09/17 [id number]        71 y.o.  G2P2002 presents complaining of vulvar irritation. Had noticed some breezed looking areas. Has a history of lichen sclerosis which has been doing well using Estrace cream and intermittent clobetasol. Reinitiated clobetasol last week when irritation got a lot worse. No discharge or odor. No urinary symptoms such as frequency dysuria or urgency low back pain fever or chills.  Past medical history,surgical history, problem list, medications, allergies, family history and social history were all reviewed and documented in the EPIC chart.  Directed ROS with pertinent positives and negatives documented in the history of present illness/assessment and plan.  Exam: Kim Ferrell assistant Vitals:   07/18/16 1411  BP: 124/76   General appearance:  Normal Abdomen soft without masses or tenderness Pelvic external BUS vagina with atrophic changes. Symmetrical pearly white skin changes from airy clitoral region to perianal region consistent with her history of lichen sclerosis. Several small cracked areas not actively bleeding. Digital exam without masses or tenderness  Assessment/Plan:  71 y.o. [id number] with flare in her lichen sclerosis. Continue with clobetasol cream at bedtime. Add Desenex during the day after sitz baths. Is allergic to silver and cannot use the Silvadene cream. Follow up if symptoms do not improve over the next several weeks.    Kim Lords MD, 2:29 PM 07/18/2016

## 2016-07-18 NOTE — Patient Instructions (Signed)
Continue with the clobetasol cream at that time. Apply Desenex cream during the day after using a sitz bath.

## 2017-10-09 ENCOUNTER — Telehealth: Payer: Self-pay | Admitting: *Deleted

## 2017-10-09 MED ORDER — CLOBETASOL PROPIONATE 0.05 % EX CREA: 1.0000 "application " | TOPICAL_CREAM | Freq: Every evening | CUTANEOUS | 1 refills | Status: DC

## 2017-10-09 NOTE — Telephone Encounter (Signed)
She has appointment to see me next week.  Okay to refill now.

## 2017-10-09 NOTE — Telephone Encounter (Signed)
-----   Message from Jerilynn Mageslaudia Shaffer sent at 10/09/2017  3:21 PM EDT ----- Regarding: RX Patient needs refill on clobetasol cream (TEMOVATE) 0.05 % [facility] [address] Patient coming in July 16 for physical. Thx

## 2017-10-09 NOTE — Telephone Encounter (Signed)
Rx sent 

## 2017-10-09 NOTE — Telephone Encounter (Signed)
Okay to refill? 

## 2017-10-14 ENCOUNTER — Encounter: Payer: Self-pay | Admitting: Gynecology

## 2017-10-14 ENCOUNTER — Ambulatory Visit: Payer: Medicare Other | Admitting: Gynecology

## 2017-10-14 VITALS — BP 124/80 | Ht 65.0 in | Wt 159.0 lb

## 2017-10-14 DIAGNOSIS — L900 Lichen sclerosus et atrophicus: Secondary | ICD-10-CM

## 2017-10-14 DIAGNOSIS — Z01411 Encounter for gynecological examination (general) (routine) with abnormal findings: Secondary | ICD-10-CM

## 2017-10-14 DIAGNOSIS — N952 Postmenopausal atrophic vaginitis: Secondary | ICD-10-CM

## 2017-10-14 MED ORDER — CLOBETASOL PROPIONATE 0.05 % EX CREA: 1.0000 "application " | TOPICAL_CREAM | Freq: Every evening | CUTANEOUS | 1 refills | Status: DC

## 2017-10-14 NOTE — Patient Instructions (Signed)
Follow-up for the bone density as scheduled.  Apply the clobetasol cream externally as needed for irritative symptoms.  Start the vaginal estradiol cream twice weekly from [facility].

## 2017-10-14 NOTE — Progress Notes (Signed)
Jule EconomyJane Santino July 13, 1945 [id number]        72 y.o.  G2P2002 for annual gynecologic exam.  Also complaining of significant vulvar irritation and discomfort.  She does have a history of lichen sclerosis and has been using clobetasol cream once weekly but notes that her prescription is old and that she starting to develop increasing discomfort.  Also has been using estradiol cream externally once a month.  Status post TAH in the past for leiomyoma.  Past medical history,surgical history, problem list, medications, allergies, family history and social history were all reviewed and documented as reviewed in the EPIC chart.  ROS:  Performed with pertinent positives and negatives included in the history, assessment and plan.   Additional significant findings : None   Exam: Kennon PortelaKim Gardner assistant Vitals:   10/14/17 0939  BP: 124/80  Weight: 159 lb (72.1 kg)  Height: 5\' 5"  (1.651 m)   Body mass index is 26.46 kg/m.  General appearance:  Normal affect, orientation and appearance. Skin: Grossly normal HEENT: Without gross lesions.  No cervical or supraclavicular adenopathy. Thyroid normal.  Lungs:  Clear without wheezing, rales or rhonchi Cardiac: RR, without RMG Abdominal:  Soft, nontender, without masses, guarding, rebound, organomegaly or hernia Breasts:  Examined lying and sitting without masses, retractions, discharge or axillary adenopathy. Pelvic:  Ext, BUS, Vagina: With significant atrophic changes.  Blanching of the skin from the periclitoral hood down both labia to the perineum consistent with her diagnosis of lichen sclerosus.  Adnexa: Without masses or tenderness    Anus and perineum: Normal   Rectovaginal: Normal sphincter tone without palpated masses or tenderness.    Assessment/Plan:  72 y.o. [id number] female for annual gynecologic exam.   1. Vulvar irritation.  History of lichen sclerosis with exam consistent with this.  Also with significant atrophic changes.  We discussed  that using estradiol cream externally once a month is not sufficient for vaginal support.  We discussed the issues of vaginal estrogen and absorption risks.  My recommendations is to start estradiol vaginal cream twice weekly and to stay on this for now for vaginal support.  Also to treat the lichen sclerosus with clobetasol 0.05% cream as a new prescription and to use it nightly for several weeks and then to taper off of this and then use it intermittently for symptoms only.  Prescription sent to her pharmacy.  Vaginal estradiol prescription called to [facility]. 2. Mammography in our system 04/2016.  Patient feels that she had her mammogram this year already and is going to call to verify this.  Breast exam normal today. 3. DEXA 2011.  She is planning for a heel bone density.  I discussed the pitfalls of heel bone density this is far as predictability for central disease.  My recommendations is to proceed with a DEXA for more accurate prediction and she agrees with this and will schedule this in follow-up for this. 4. Pap smear several years ago.  No Pap smear done today.  We both agree per current screening guidelines to stop screening based on age and hysterectomy history. 5. Colonoscopy 2018.  Repeat at their recommended interval. 6. Health maintenance.  No routine lab work done as patient does this elsewhere.  Follow-up for bone density.  Follow-up for annual exam in 1 year.  Additional 10 minutes time in excess of her routine gynecologic exam was spent in direct face to face counseling and coordination of care in regards to her lichen sclerosus and atrophic vaginitis  treatment.     Dara Lords MD, 10:14 AM 10/14/2017

## 2017-10-15 ENCOUNTER — Telehealth: Payer: Self-pay | Admitting: *Deleted

## 2017-10-15 MED ORDER — NONFORMULARY OR COMPOUNDED ITEM: 3 refills | Status: DC

## 2017-10-15 NOTE — Telephone Encounter (Signed)
Rx called in 

## 2017-10-15 NOTE — Telephone Encounter (Signed)
-----   Message from Dara Lordsimothy P Fontaine, MD sent at 10/14/2017 10:13 AM EDT ----- Call in vaginal estradiol prefilled syringes, twice weekly, 2966-month supply, refill x1 year to [facility]

## 2017-10-30 ENCOUNTER — Other Ambulatory Visit: Payer: Self-pay | Admitting: Gynecology

## 2017-10-30 ENCOUNTER — Ambulatory Visit (INDEPENDENT_AMBULATORY_CARE_PROVIDER_SITE_OTHER): Payer: Medicare Other

## 2017-10-30 DIAGNOSIS — Z780 Asymptomatic menopausal state: Secondary | ICD-10-CM | POA: Diagnosis not present

## 2017-10-30 DIAGNOSIS — Z01411 Encounter for gynecological examination (general) (routine) with abnormal findings: Secondary | ICD-10-CM

## 2017-10-30 DIAGNOSIS — M8589 Other specified disorders of bone density and structure, multiple sites: Secondary | ICD-10-CM

## 2017-10-30 DIAGNOSIS — M8580 Other specified disorders of bone density and structure, unspecified site: Secondary | ICD-10-CM

## 2017-10-30 HISTORY — DX: Other specified disorders of bone density and structure, unspecified site: M85.80

## 2017-10-31 ENCOUNTER — Encounter: Payer: Self-pay | Admitting: Gynecology

## 2017-12-22 ENCOUNTER — Other Ambulatory Visit: Payer: Self-pay

## 2017-12-22 ENCOUNTER — Emergency Department (HOSPITAL_BASED_OUTPATIENT_CLINIC_OR_DEPARTMENT_OTHER)
Admission: EM | Admit: 2017-12-22 | Discharge: 2017-12-22 | Disposition: A | Discharge status: Home / Self Care | Payer: Medicare Other | Attending: Emergency Medicine | Admitting: Emergency Medicine

## 2017-12-22 ENCOUNTER — Encounter (HOSPITAL_BASED_OUTPATIENT_CLINIC_OR_DEPARTMENT_OTHER): Payer: Self-pay | Admitting: *Deleted

## 2017-12-22 ENCOUNTER — Emergency Department (HOSPITAL_BASED_OUTPATIENT_CLINIC_OR_DEPARTMENT_OTHER): Payer: Medicare Other

## 2017-12-22 DIAGNOSIS — Y929 Unspecified place or not applicable: Secondary | ICD-10-CM | POA: Diagnosis not present

## 2017-12-22 DIAGNOSIS — Y999 Unspecified external cause status: Secondary | ICD-10-CM | POA: Insufficient documentation

## 2017-12-22 DIAGNOSIS — S61216A Laceration without foreign body of right little finger without damage to nail, initial encounter: Secondary | ICD-10-CM | POA: Insufficient documentation

## 2017-12-22 DIAGNOSIS — W25XXXA Contact with sharp glass, initial encounter: Secondary | ICD-10-CM | POA: Diagnosis not present

## 2017-12-22 DIAGNOSIS — Z79899 Other long term (current) drug therapy: Secondary | ICD-10-CM | POA: Diagnosis not present

## 2017-12-22 DIAGNOSIS — I10 Essential (primary) hypertension: Secondary | ICD-10-CM | POA: Insufficient documentation

## 2017-12-22 DIAGNOSIS — Z87891 Personal history of nicotine dependence: Secondary | ICD-10-CM | POA: Diagnosis not present

## 2017-12-22 DIAGNOSIS — Z23 Encounter for immunization: Secondary | ICD-10-CM | POA: Diagnosis not present

## 2017-12-22 DIAGNOSIS — J45909 Unspecified asthma, uncomplicated: Secondary | ICD-10-CM | POA: Diagnosis not present

## 2017-12-22 DIAGNOSIS — S6991XA Unspecified injury of right wrist, hand and finger(s), initial encounter: Secondary | ICD-10-CM | POA: Diagnosis present

## 2017-12-22 DIAGNOSIS — S61214A Laceration without foreign body of right ring finger without damage to nail, initial encounter: Secondary | ICD-10-CM

## 2017-12-22 DIAGNOSIS — Y939 Activity, unspecified: Secondary | ICD-10-CM | POA: Insufficient documentation

## 2017-12-22 MED ORDER — LIDOCAINE HCL (PF) 1 % IJ SOLN
5.0000 mL | Freq: Once | INTRAMUSCULAR | Status: AC
  Administered 2017-12-22: 5 mL
  Filled 2017-12-22: qty 5

## 2017-12-22 MED ORDER — TETANUS-DIPHTH-ACELL PERTUSSIS 5-2.5-18.5 LF-MCG/0.5 IM SUSP
0.5000 mL | Freq: Once | INTRAMUSCULAR | Status: AC
  Administered 2017-12-22: 0.5 mL via INTRAMUSCULAR
  Filled 2017-12-22: qty 0.5

## 2017-12-22 NOTE — ED Triage Notes (Signed)
[facility] to her right 4th and 5th fingers. She cut her hand on a glass bowl that broke.

## 2017-12-22 NOTE — ED Notes (Signed)
Family at bedside. 

## 2017-12-22 NOTE — Discharge Instructions (Signed)
Please read instructions below.  Keep your wound clean and covered. In 24 hours, you can get your wound wet; gently clean it with soap and water, pat it dry, and reapply a clean bandage. You can take tylenol as needed for pain Follow up with your primary care or urgent care for wound recheck in 7 days.  Return to the ER for fever, pus draining from wound, redness, or new or worsening symptoms.

## 2017-12-22 NOTE — ED Notes (Signed)
Pt. Was holding a bowl that broke causing laceration to both fingers.

## 2017-12-22 NOTE — ED Provider Notes (Signed)
[facility] EMERGENCY DEPARTMENT Provider Note   CSN: [id number] Arrival date & time: 12/22/17  1954     History   Chief Complaint Chief Complaint  Patient presents with  . Laceration    HPI Kim Ferrell is a 72 y.o. female presented to the ED with complaint of lacerations to the right hand that occurred this evening.  She states she was climbing over a baby gate and tripped and fell.  She states she was holding to glass balls at the time which broke caused a laceration to her right hand.  She reports lacerations to the fourth and fifth digits without significant pain.  Denies difficulty with range of motion.  States she reported to the urgent care though did not want to wait for treatment therefore reported here.  Reports she takes Humira daily though is starting Bactrim today for an upper respiratory infection.  Tetanus is not up-to-date.  The history is provided by the patient.    Past Medical History:  Diagnosis Date  . Anemia    hx  . Arthritis    hands, knees  . Asthma    has not needed any med. in 2 yrs.  . Chronic bronchitis (HCC)   . Dental crowns present   . Depression   . GERD (gastroesophageal reflux disease)   . Hypertension    under control, has been on med. x 3-4 yrs.  . Lichen sclerosus   . Migraines   . Osteopenia 10/2017   T score -1.8 FRAX 12% / 2.3% excluding history of lower leg fracture falling downstairs.  . Pneumonia    hx  . Psoriasis   . Seasonal allergies   . Trimalleolar fracture of right ankle 09/16/2011    Patient Active Problem List   Diagnosis Date Noted  . Lichen sclerosus   . Psoriasis   . Migraines     Past Surgical History:  Procedure Laterality Date  . ABDOMINAL HYSTERECTOMY     partial  . ANTERIOR CERVICAL DECOMP/DISCECTOMY FUSION N/A 11/02/2012   Procedure: ANTERIOR CERVICAL DECOMPRESSION/DISCECTOMY FUSION  cervical five-six cervical six-seven;  Surgeon: Barnett Abu, MD;  Location: [facility];  Service:  Neurosurgery;  Laterality: N/A;  Cervical five six  Cervical six-seven Anterior cervical decompression/diskectomy/fusion  . HAND SURGERY     rt strep infection  . ORIF ANKLE FRACTURE  09/17/2011   Procedure: OPEN REDUCTION INTERNAL FIXATION (ORIF) ANKLE FRACTURE;  Surgeon: Mable Paris, MD;  Location: [facility];  Service: Orthopedics;  Laterality: Right;  . TONSILLECTOMY       OB History    Gravida  2   Para  2   Term  2   Preterm      AB      Living  2     SAB      TAB      Ectopic      Multiple      Live Births               Home Medications    Prior to Admission medications   Medication Sig Start Date End Date Taking? Authorizing Provider  Adalimumab (HUMIRA Gonzales) Inject into the skin.    [provider]  albuterol (PROVENTIL HFA;VENTOLIN HFA) 108 (90 BASE) MCG/ACT inhaler Inhale 2 puffs into the lungs every 6 (six) hours as needed for shortness of breath.    [provider]  buPROPion (WELLBUTRIN SR) 100 MG 12 hr tablet Take 100 mg by  mouth daily.    [provider]  clobetasol cream (TEMOVATE) 0.05 % Apply 1 application topically every evening. As needed for irritation 10/14/17   Fontaine, Nadyne Coombes, MD  estradiol (ESTRACE) 0.1 MG/GM vaginal cream Place 2 g vaginally 3 (three) times a week. 11/08/15   Fontaine, Nadyne Coombes, MD  fluticasone (FLONASE) 50 MCG/ACT nasal spray Place 2 sprays into the nose daily as needed for rhinitis.    [provider]  halobetasol (ULTRAVATE) 0.05 % cream Apply 1 application topically daily.     [provider]  losartan-hydrochlorothiazide (HYZAAR) 100-25 MG per tablet Take 1 tablet by mouth daily.    [provider]  NONFORMULARY OR COMPOUNDED ITEM Estradiol vaginal cream 0.02% insert 1 applicator twice weekly 10/15/17   Fontaine, Nadyne Coombes, MD  omeprazole (PRILOSEC) 20 MG capsule Take 20 mg by mouth 2 (two) times daily.    [provider]    PARoxetine (PAXIL) 10 MG tablet Take 10 mg by mouth every morning.    [provider]  polyethylene glycol (MIRALAX / GLYCOLAX) packet Take 17 g by mouth daily as needed (Constipation).    [provider]  SUMAtriptan (IMITREX) 100 MG tablet Take 100 mg by mouth every 2 (two) hours as needed for migraine. For headache    [provider]    Family History Family History  Problem Relation Age of Onset  . Diabetes Mother   . Heart disease Mother   . Cancer Father        skin  . Heart disease Father   . Stroke Sister   . Heart disease Brother   . Cancer Brother        Colon    Social History Social History   Tobacco Use  . Smoking status: Former Smoker    Packs/day: 1.00    Years: 5.00    Pack years: 5.00    Types: Cigarettes    Last attempt to quit: 10/28/1974    Years since quitting: 43.1  . Smokeless tobacco: Never Used  . Tobacco comment: quit smoking 36 yrs. ago  Substance Use Topics  . Alcohol use: Yes    Comment: daily glass of wine  . Drug use: No     Allergies   Adhesive [tape]; Gold-containing drug products; Neosporin [neomycin-bacitracin zn-polymyx]; Nickel; and Silver   Review of Systems Review of Systems  Musculoskeletal: Negative for arthralgias.  Skin: Positive for wound.  Neurological: Negative for numbness.     Physical Exam Updated Vital Signs BP (!) 142/92 (BP Location: Left Arm)   Pulse 74   Temp 97.7 F (36.5 C) (Oral)   Resp 18   Ht 5\' 6"  (1.676 m)   Wt 73 kg   LMP 04/02/1987   SpO2 96%   BMI 25.99 kg/m   Physical Exam  Constitutional: She appears well-developed and well-nourished. No distress.  HENT:  Head: Normocephalic and atraumatic.  Eyes: Conjunctivae are normal.  Cardiovascular: Normal rate and intact distal pulses.  Pulmonary/Chest: Effort normal.  Musculoskeletal:  Right hand; dorsal aspect of fourth and fifth digits with lacerations.  There is a 2 cm superficial laceration over the middle  phalanx of the fourth digit, a superficial small flap laceration over the PIP joint on the fourth digit as well as a gaping 2 cm laceration over the proximal phalanx of the fifth digit.  None appear grossly contaminated without evidence of foreign body.  Preserved active and resistive extension of digits.  Normal sensation  Psychiatric:  She has a normal mood and affect. Her behavior is normal.  Nursing note and vitals reviewed.    ED Treatments / Results  Labs (all labs ordered are listed, but only abnormal results are displayed) Labs Reviewed - No data to display  EKG None  Radiology Dg Hand Complete Right  Result Date: 12/22/2017 CLINICAL DATA:  72 year-old female fell and cut her RIGHT hand today on a ceramic bowl approx 1 hour ago. Laceration to the little finger. EXAM: RIGHT HAND - COMPLETE 3+ VIEW COMPARISON:  None. FINDINGS: No acute osseous fracture line or displaced fracture fragment seen. Soft tissue laceration overlying the proximal phalanx of the LEFT fifth finger, without underlying osseous abnormality. 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 No foreign body seen within the soft tissues. IMPRESSION: 1. Soft tissue laceration overlying the proximal phalanx of the fifth finger, without underlying osseous abnormality. 2. No radiodense foreign body appreciated within the soft tissues. 3. No acute appearing osseous abnormality. 4. DJD at the first [facility] joint, mild to moderate in degree. Electronically Signed   By: Bary Richard M.D.   On: 12/22/2017 20:50    Procedures .Marland KitchenLaceration Repair Date/Time: 12/22/2017 10:01 PM Performed by: Faylene Allerton, [place] N, PA-C Authorized by: Demetrice Combes, [place] N, PA-C   Consent:    Consent obtained:  Verbal   Consent given by:  Patient   Risks discussed:  Infection, pain and poor cosmetic result   Alternatives discussed:  No treatment Anesthesia (see MAR for exact dosages):    Anesthesia method:  Local  infiltration   Local anesthetic:  Lidocaine 1% w/o epi Laceration details:    Location:  Finger   Finger location:  R small finger   Length (cm):  2 Repair type:    Repair type:  Simple Pre-procedure details:    Preparation:  Patient was prepped and draped in usual sterile fashion and imaging obtained to evaluate for foreign bodies Exploration:    Hemostasis achieved with:  Direct pressure   Wound exploration: wound explored through full range of motion and entire depth of wound probed and visualized     Wound extent: no foreign bodies/material noted and no tendon damage noted   Treatment:    Area cleansed with:  Saline   Amount of cleaning:  Extensive Skin repair:    Repair method:  Sutures   Suture size:  5-0   Suture material:  Prolene   Suture technique:  Simple interrupted   Number of sutures:  4 Approximation:    Approximation:  Close Post-procedure details:    Dressing:  Non-adherent dressing   Patient tolerance of procedure:  Tolerated well, no immediate complications   (including critical care time)  Medications Ordered in ED Medications  Tdap (BOOSTRIX) injection 0.5 mL (0.5 mLs Intramuscular Given 12/22/17 2121)  lidocaine (PF) (XYLOCAINE) 1 % injection 5 mL (5 mLs Infiltration Given 12/22/17 2121)     Initial Impression / Assessment and Plan / ED Course  I have reviewed the triage vital signs and the nursing notes.  Pertinent labs & imaging results that were available during my care of the patient were reviewed by me and considered in my medical decision making (see chart for details).    Pt with lacerations to right hand that occurred after a trip and fall while holding dishes today. Preserved active and resistive ROM. Wound explored and base of wound visualized in a bloodless field without evidence of foreign body. Laceration occurred < 8  hours prior to repair which was well tolerated.  Tdap updated/up-to-date.  Pt taking humira though is starting bactrim today  for URI, which will cover common skin microbes. No comorbidities to effect normal wound healing.  Discussed suture home care with patient and answered questions. Pt to follow-up for wound check and suture removal in 7 days; they are to return to the ED sooner for signs of infection. Pt is hemodynamically stable with no complaints prior to dc.   Discussed results, findings, treatment and follow up. Patient advised of return precautions. Patient verbalized understanding and agreed with plan.  Final Clinical Impressions(s) / ED Diagnoses   Final diagnoses:  Laceration of right little finger w/o foreign body w/o damage to nail, initial encounter  Laceration of right ring finger w/o foreign body w/o damage to nail, initial encounter    ED Discharge Orders    None       Jjesus Dingley, [place] N, PA-C 12/22/17 2204    Terrilee FilesButler, Michael C, MD 12/23/17 1147

## 2018-04-07 ENCOUNTER — Encounter: Payer: Self-pay | Admitting: Gynecology

## 2019-01-06 ENCOUNTER — Encounter: Payer: Self-pay | Admitting: Gynecology

## 2019-03-16 ENCOUNTER — Other Ambulatory Visit: Payer: Self-pay

## 2019-03-17 ENCOUNTER — Ambulatory Visit (INDEPENDENT_AMBULATORY_CARE_PROVIDER_SITE_OTHER): Payer: Medicare Other | Admitting: Gynecology

## 2019-03-17 ENCOUNTER — Encounter: Payer: Self-pay | Admitting: Gynecology

## 2019-03-17 ENCOUNTER — Telehealth: Payer: Self-pay | Admitting: *Deleted

## 2019-03-17 VITALS — BP 124/82 | Ht 65.0 in | Wt 160.0 lb

## 2019-03-17 DIAGNOSIS — M8580 Other specified disorders of bone density and structure, unspecified site: Secondary | ICD-10-CM | POA: Diagnosis not present

## 2019-03-17 DIAGNOSIS — Z01419 Encounter for gynecological examination (general) (routine) without abnormal findings: Secondary | ICD-10-CM

## 2019-03-17 DIAGNOSIS — N952 Postmenopausal atrophic vaginitis: Secondary | ICD-10-CM | POA: Diagnosis not present

## 2019-03-17 DIAGNOSIS — L900 Lichen sclerosus et atrophicus: Secondary | ICD-10-CM

## 2019-03-17 MED ORDER — NONFORMULARY OR COMPOUNDED ITEM: 4 refills | Status: DC

## 2019-03-17 NOTE — Telephone Encounter (Signed)
Rx called in 

## 2019-03-17 NOTE — Patient Instructions (Signed)
Follow-up in 1 year, sooner as needed. 

## 2019-03-17 NOTE — Progress Notes (Signed)
    Kim Ferrell 02-Jul-1945 [id number]        72 y.o.  G2P2002 for annual gynecologic exam.  Several issues noted below  Past medical history,surgical history, problem list, medications, allergies, family history and social history were all reviewed and documented as reviewed in the EPIC chart.  ROS:  Performed with pertinent positives and negatives included in the history, assessment and plan.   Additional significant findings : None   Exam: Caryn Bee assistant Vitals:   03/17/19 1207  BP: 124/82  Weight: 160 lb (72.6 kg)  Height: 5\' 5"  (1.651 m)   Body mass index is 26.63 kg/m.  General appearance:  Normal affect, orientation and appearance. Skin: Grossly normal HEENT: Without gross lesions.  No cervical or supraclavicular adenopathy. Thyroid normal.  Lungs:  Clear without wheezing, rales or rhonchi Cardiac: RR, without RMG Abdominal:  Soft, nontender, without masses, guarding, rebound, organomegaly or hernia Breasts:  Examined lying and sitting without masses, retractions, discharge or axillary adenopathy. Pelvic:  Ext, BUS, Vagina: With atrophic changes.  Symmetrical blanching of the skin from the periclitoral hood down both labia to the perineum consistent with lichen sclerosis  Adnexa: Without masses or tenderness    Anus and perineum: Normal   Rectovaginal: Normal sphincter tone without palpated masses or tenderness.    Assessment/Plan:  73 y.o. G50P2002 female for annual gynecologic exam.  Status post hysterectomy in the past  1. Lichen sclerosis.  Using clobetasol 0.05% cream intermittently with good results.  Has supply but will call when needs more. 2. Atrophic vaginitis.  Using vaginal estradiol cream through [facility] once weekly.  Doing well with this and wants to continue.  Refill x1 year provided.  We have discussed the issues of absorption in the past and she is comfortable continuing. 3. Pap smear several years ago.  No Pap smear done today.  No  history of significant abnormal Pap smear.  Per current screening guidelines we both agree to stop screening. 4. Mammography coming due in January and I reminded her to schedule this.  Breast exam normal today. 5. Colonoscopy 2018.  Repeat at their recommended interval. 6. Osteopenia.  DEXA 2019 T score -1.8 FRAX 12% / 2.3%.  Recommend repeat DEXA next year at 2+ year interval. 7. Health maintenance.  No routine lab work done as patient does this elsewhere.  Follow-up 1 year, sooner as needed.   Anastasio Auerbach MD, 12:52 PM 03/17/2019

## 2019-03-17 NOTE — Telephone Encounter (Signed)
-----   Message from Anastasio Auerbach, MD sent at 03/17/2019 12:50 PM EST ----- Call into [facility] refill of vaginal estradiol prefilled syringes twice weekly 27-month supply refill x1 year

## 2019-05-16 IMAGING — DX DG HAND COMPLETE 3+V*R*
3 series · 4 of 4 positions shown · non-contrast
Comparison: None.

CLINICAL DATA: 71 year-old female fell and cut her RIGHT hand today
on a ceramic bowl approx 1 hour ago. Laceration to the little
finger.

EXAM:
RIGHT HAND - COMPLETE 3+ VIEW

[hand pa]
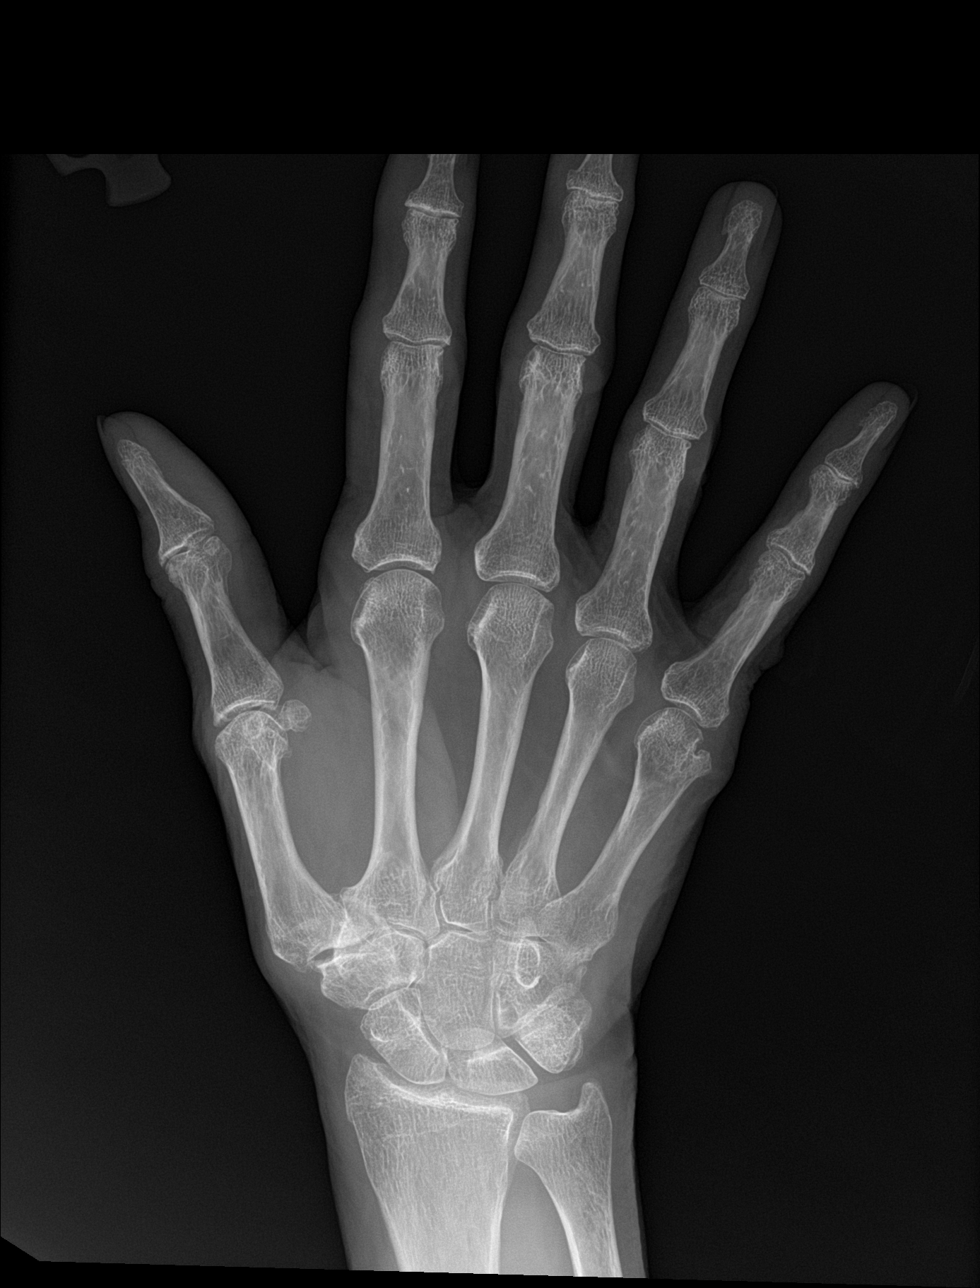

[hand obl]
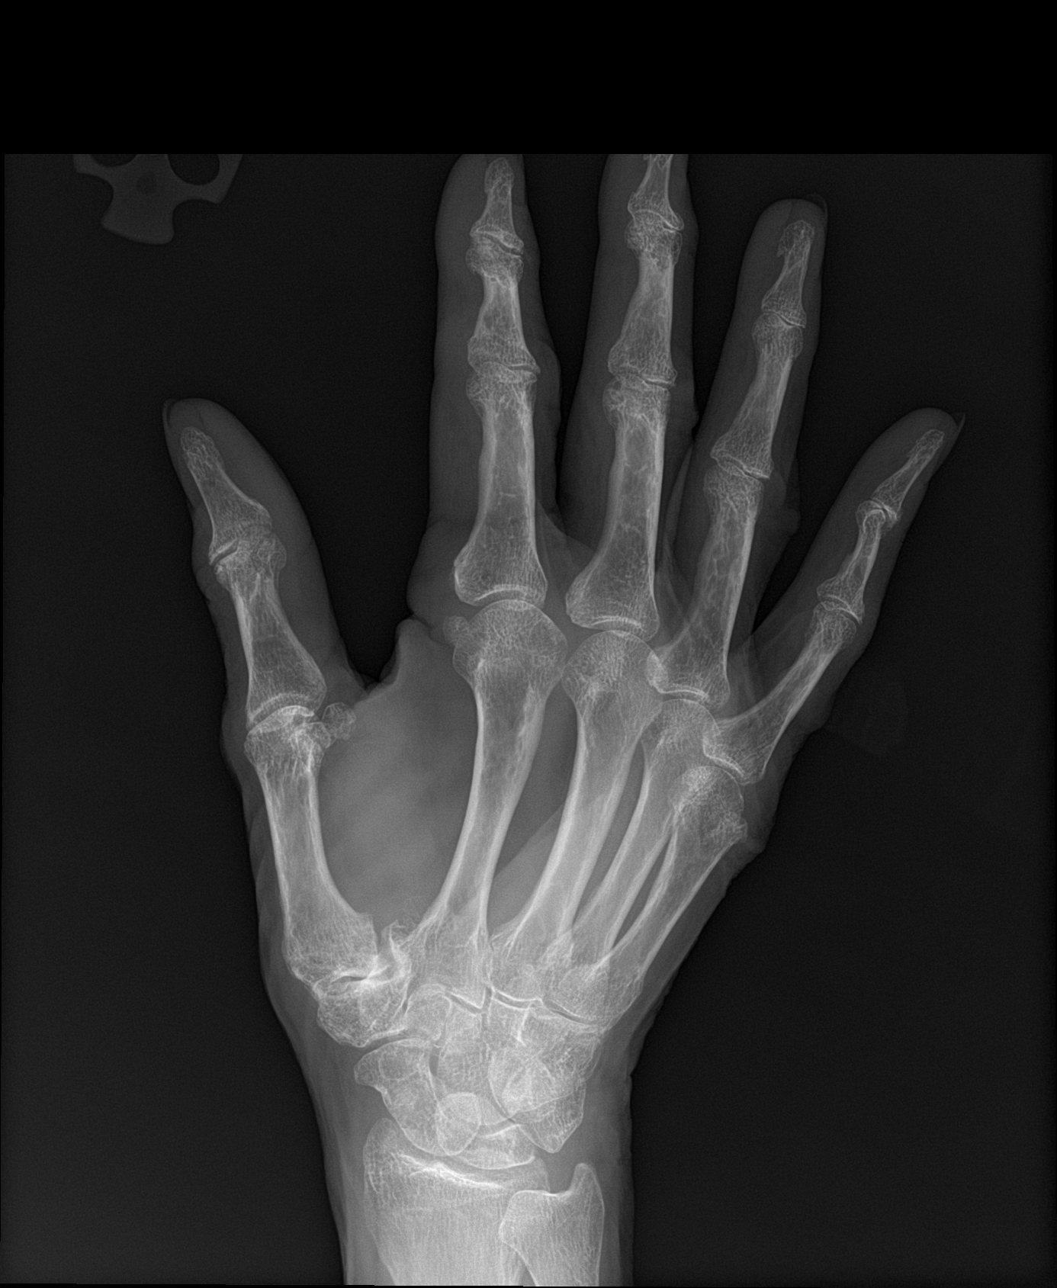

[Series 3: hand lat · 0.14mm/px · 2 of 2 slices shown]
[im 1/2]
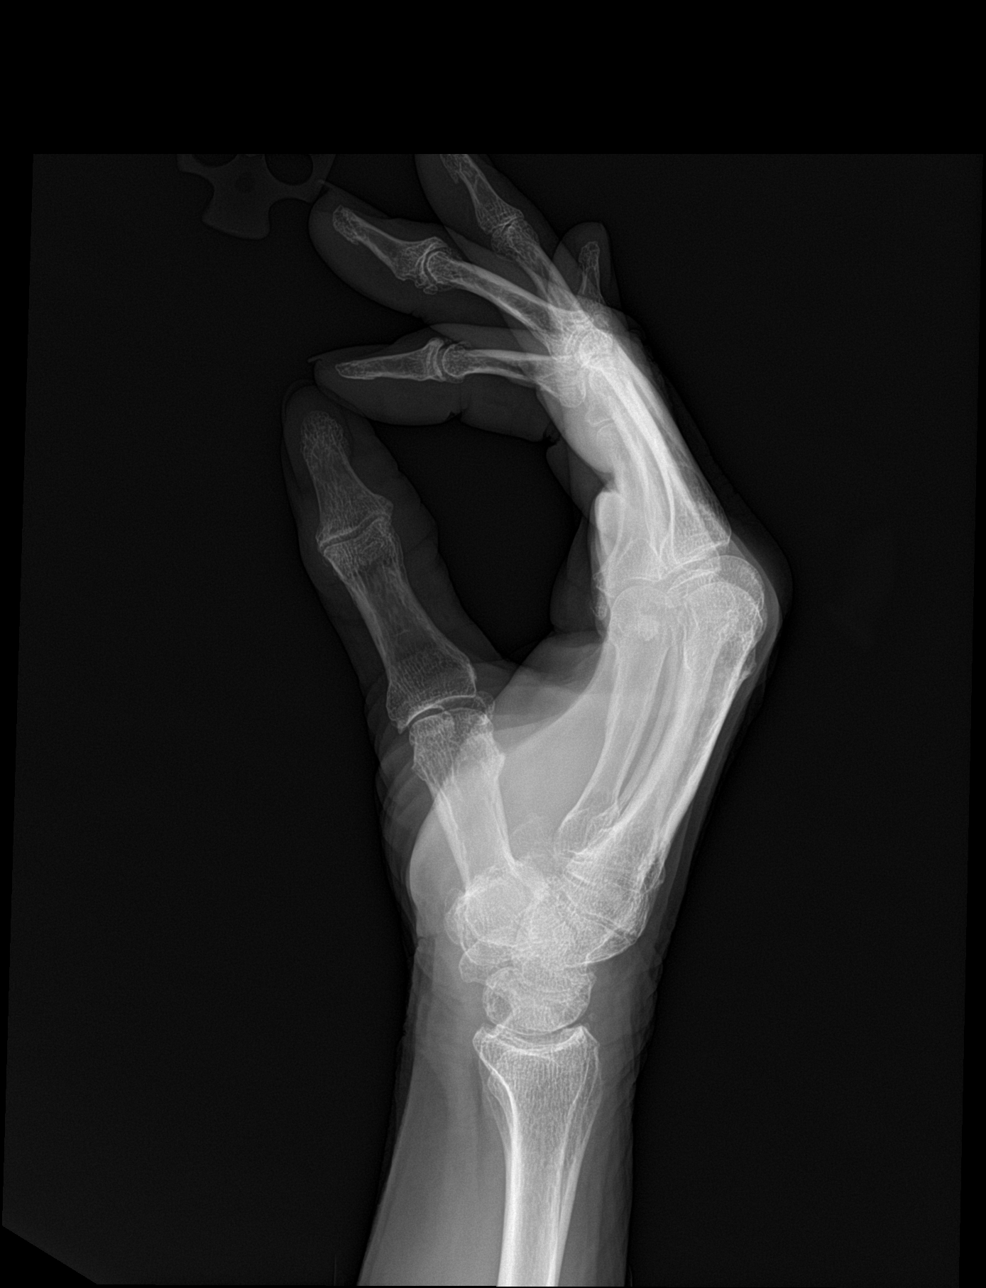
[im 2/2]
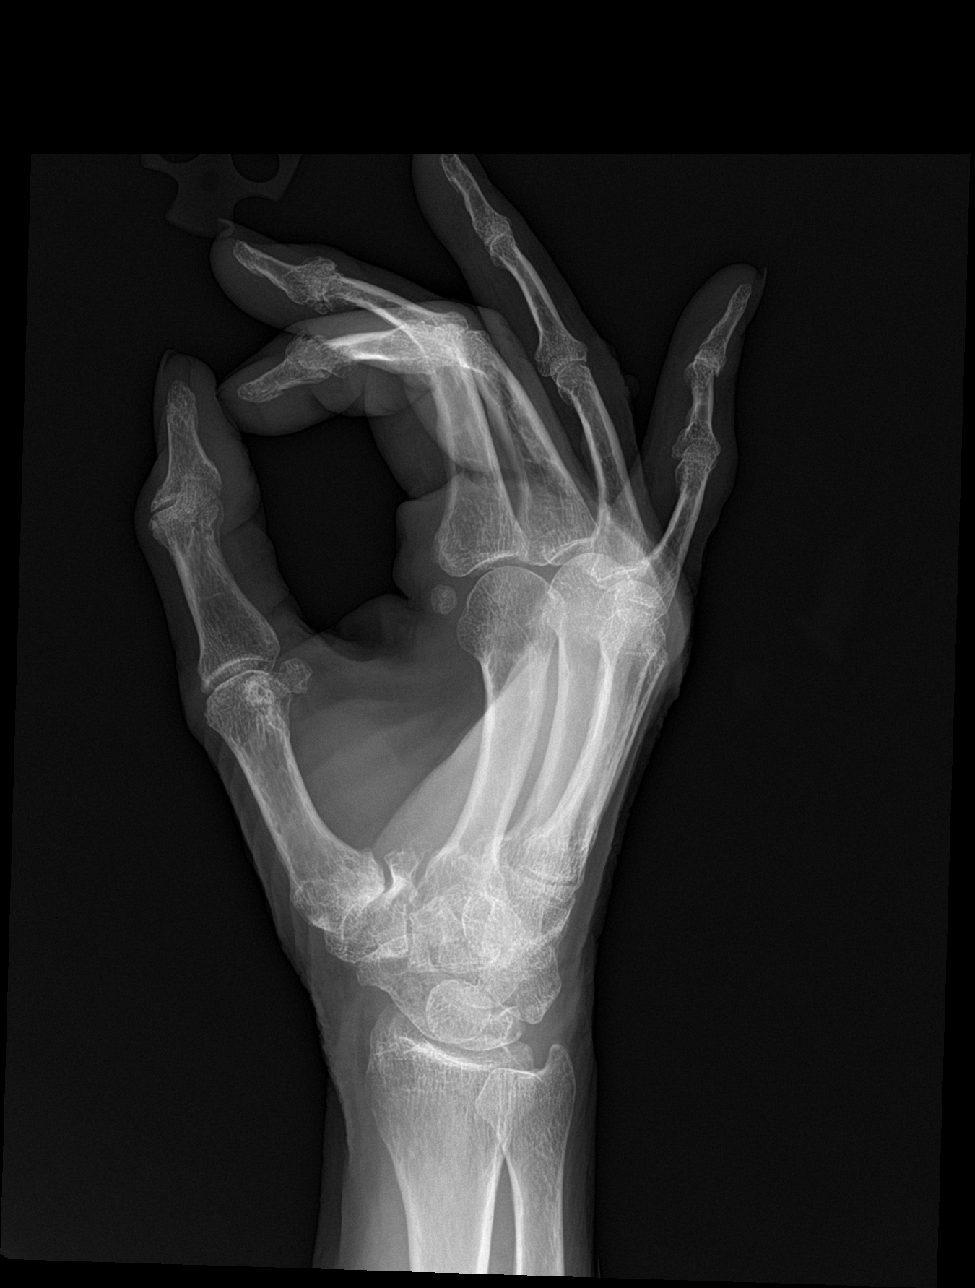

[4 of 4 positions shown; findings below may reference images not displayed]

FINDINGS: No acute osseous fracture line or displaced fracture fragment seen.
Soft tissue laceration overlying the proximal phalanx of the LEFT
fifth finger, without underlying osseous abnormality. Degenerative
osteoarthritis noted at the first [facility] joint, mild to moderate in
degree. No foreign body seen within the soft tissues.
IMPRESSION: 1. Soft tissue laceration overlying the proximal phalanx of the
fifth finger, without underlying osseous abnormality.
2. No radiodense foreign body appreciated within the soft tissues.
3. No acute appearing osseous abnormality.
4. DJD at the first [facility] joint, mild to moderate in degree.

## 2019-05-28 ENCOUNTER — Ambulatory Visit: Payer: Medicare Other

## 2019-08-26 ENCOUNTER — Ambulatory Visit (HOSPITAL_COMMUNITY)
Admission: RE | Admit: 2019-08-26 | Discharge: 2019-08-26 | Disposition: A | Discharge status: Home / Self Care | Payer: Medicare PPO | Source: Ambulatory Visit | Attending: Vascular Surgery | Admitting: Vascular Surgery

## 2019-08-26 ENCOUNTER — Other Ambulatory Visit (HOSPITAL_COMMUNITY): Payer: Self-pay | Admitting: Physician Assistant

## 2019-08-26 ENCOUNTER — Other Ambulatory Visit: Payer: Self-pay

## 2019-08-26 DIAGNOSIS — R52 Pain, unspecified: Secondary | ICD-10-CM | POA: Insufficient documentation

## 2019-08-26 DIAGNOSIS — M79669 Pain in unspecified lower leg: Secondary | ICD-10-CM | POA: Diagnosis not present

## 2019-09-24 DIAGNOSIS — L570 Actinic keratosis: Secondary | ICD-10-CM | POA: Diagnosis not present

## 2019-09-24 DIAGNOSIS — Z79899 Other long term (current) drug therapy: Secondary | ICD-10-CM | POA: Diagnosis not present

## 2019-09-24 DIAGNOSIS — L409 Psoriasis, unspecified: Secondary | ICD-10-CM | POA: Diagnosis not present

## 2019-10-11 DIAGNOSIS — M25561 Pain in right knee: Secondary | ICD-10-CM | POA: Diagnosis not present

## 2019-11-04 DIAGNOSIS — M25561 Pain in right knee: Secondary | ICD-10-CM | POA: Diagnosis not present

## 2019-11-16 DIAGNOSIS — F419 Anxiety disorder, unspecified: Secondary | ICD-10-CM | POA: Diagnosis not present

## 2019-11-16 DIAGNOSIS — K219 Gastro-esophageal reflux disease without esophagitis: Secondary | ICD-10-CM | POA: Diagnosis not present

## 2019-11-16 DIAGNOSIS — L4050 Arthropathic psoriasis, unspecified: Secondary | ICD-10-CM | POA: Diagnosis not present

## 2019-11-16 DIAGNOSIS — J309 Allergic rhinitis, unspecified: Secondary | ICD-10-CM | POA: Diagnosis not present

## 2019-11-16 DIAGNOSIS — Z87891 Personal history of nicotine dependence: Secondary | ICD-10-CM | POA: Diagnosis not present

## 2019-11-16 DIAGNOSIS — I10 Essential (primary) hypertension: Secondary | ICD-10-CM | POA: Diagnosis not present

## 2019-11-16 DIAGNOSIS — G43909 Migraine, unspecified, not intractable, without status migrainosus: Secondary | ICD-10-CM | POA: Diagnosis not present

## 2019-11-16 DIAGNOSIS — F325 Major depressive disorder, single episode, in full remission: Secondary | ICD-10-CM | POA: Diagnosis not present

## 2019-11-16 DIAGNOSIS — K5900 Constipation, unspecified: Secondary | ICD-10-CM | POA: Diagnosis not present

## 2019-11-16 DIAGNOSIS — Z79899 Other long term (current) drug therapy: Secondary | ICD-10-CM | POA: Diagnosis not present

## 2019-11-16 DIAGNOSIS — Z881 Allergy status to other antibiotic agents status: Secondary | ICD-10-CM | POA: Diagnosis not present

## 2019-11-25 DIAGNOSIS — S83241A Other tear of medial meniscus, current injury, right knee, initial encounter: Secondary | ICD-10-CM | POA: Diagnosis not present

## 2019-12-09 DIAGNOSIS — Z79899 Other long term (current) drug therapy: Secondary | ICD-10-CM | POA: Diagnosis not present

## 2019-12-09 DIAGNOSIS — R251 Tremor, unspecified: Secondary | ICD-10-CM | POA: Diagnosis not present

## 2019-12-09 DIAGNOSIS — J383 Other diseases of vocal cords: Secondary | ICD-10-CM | POA: Diagnosis not present

## 2019-12-09 DIAGNOSIS — J3802 Paralysis of vocal cords and larynx, bilateral: Secondary | ICD-10-CM | POA: Diagnosis not present

## 2019-12-09 DIAGNOSIS — R490 Dysphonia: Secondary | ICD-10-CM | POA: Diagnosis not present

## 2019-12-09 DIAGNOSIS — S83241A Other tear of medial meniscus, current injury, right knee, initial encounter: Secondary | ICD-10-CM | POA: Diagnosis not present

## 2019-12-09 DIAGNOSIS — J385 Laryngeal spasm: Secondary | ICD-10-CM | POA: Diagnosis not present

## 2019-12-09 DIAGNOSIS — Z87891 Personal history of nicotine dependence: Secondary | ICD-10-CM | POA: Diagnosis not present

## 2019-12-17 DIAGNOSIS — M25561 Pain in right knee: Secondary | ICD-10-CM | POA: Diagnosis not present

## 2019-12-23 DIAGNOSIS — R1313 Dysphagia, pharyngeal phase: Secondary | ICD-10-CM | POA: Diagnosis not present

## 2019-12-23 DIAGNOSIS — R633 Feeding difficulties: Secondary | ICD-10-CM | POA: Diagnosis not present

## 2019-12-27 DIAGNOSIS — R490 Dysphonia: Secondary | ICD-10-CM | POA: Diagnosis not present

## 2019-12-27 DIAGNOSIS — J301 Allergic rhinitis due to pollen: Secondary | ICD-10-CM | POA: Diagnosis not present

## 2019-12-27 DIAGNOSIS — G43909 Migraine, unspecified, not intractable, without status migrainosus: Secondary | ICD-10-CM | POA: Diagnosis not present

## 2019-12-27 DIAGNOSIS — I10 Essential (primary) hypertension: Secondary | ICD-10-CM | POA: Diagnosis not present

## 2019-12-27 DIAGNOSIS — Z23 Encounter for immunization: Secondary | ICD-10-CM | POA: Diagnosis not present

## 2019-12-27 DIAGNOSIS — F329 Major depressive disorder, single episode, unspecified: Secondary | ICD-10-CM | POA: Diagnosis not present

## 2020-01-04 DIAGNOSIS — S83241A Other tear of medial meniscus, current injury, right knee, initial encounter: Secondary | ICD-10-CM | POA: Diagnosis not present

## 2020-01-25 DIAGNOSIS — D485 Neoplasm of uncertain behavior of skin: Secondary | ICD-10-CM | POA: Diagnosis not present

## 2020-01-25 DIAGNOSIS — J3489 Other specified disorders of nose and nasal sinuses: Secondary | ICD-10-CM | POA: Diagnosis not present

## 2020-02-04 DIAGNOSIS — S83241A Other tear of medial meniscus, current injury, right knee, initial encounter: Secondary | ICD-10-CM | POA: Diagnosis not present

## 2020-02-04 DIAGNOSIS — M2241 Chondromalacia patellae, right knee: Secondary | ICD-10-CM | POA: Diagnosis not present

## 2020-02-04 DIAGNOSIS — G8918 Other acute postprocedural pain: Secondary | ICD-10-CM | POA: Diagnosis not present

## 2020-02-04 DIAGNOSIS — S83281A Other tear of lateral meniscus, current injury, right knee, initial encounter: Secondary | ICD-10-CM | POA: Diagnosis not present

## 2020-02-15 DIAGNOSIS — S83241D Other tear of medial meniscus, current injury, right knee, subsequent encounter: Secondary | ICD-10-CM | POA: Diagnosis not present

## 2020-03-01 DIAGNOSIS — S83241D Other tear of medial meniscus, current injury, right knee, subsequent encounter: Secondary | ICD-10-CM | POA: Diagnosis not present

## 2020-03-08 DIAGNOSIS — S83241D Other tear of medial meniscus, current injury, right knee, subsequent encounter: Secondary | ICD-10-CM | POA: Diagnosis not present

## 2020-03-10 DIAGNOSIS — S83241D Other tear of medial meniscus, current injury, right knee, subsequent encounter: Secondary | ICD-10-CM | POA: Diagnosis not present

## 2020-03-15 DIAGNOSIS — S83241D Other tear of medial meniscus, current injury, right knee, subsequent encounter: Secondary | ICD-10-CM | POA: Diagnosis not present

## 2020-04-11 DIAGNOSIS — R059 Cough, unspecified: Secondary | ICD-10-CM | POA: Diagnosis not present

## 2020-04-11 DIAGNOSIS — R509 Fever, unspecified: Secondary | ICD-10-CM | POA: Diagnosis not present

## 2020-04-11 DIAGNOSIS — L4050 Arthropathic psoriasis, unspecified: Secondary | ICD-10-CM | POA: Diagnosis not present

## 2020-04-24 DIAGNOSIS — R112 Nausea with vomiting, unspecified: Secondary | ICD-10-CM | POA: Diagnosis not present

## 2020-04-24 DIAGNOSIS — Z20822 Contact with and (suspected) exposure to covid-19: Secondary | ICD-10-CM | POA: Diagnosis not present

## 2020-04-24 DIAGNOSIS — J0110 Acute frontal sinusitis, unspecified: Secondary | ICD-10-CM | POA: Diagnosis not present

## 2020-04-24 DIAGNOSIS — R059 Cough, unspecified: Secondary | ICD-10-CM | POA: Diagnosis not present

## 2020-04-24 DIAGNOSIS — Z03818 Encounter for observation for suspected exposure to other biological agents ruled out: Secondary | ICD-10-CM | POA: Diagnosis not present

## 2020-04-27 DIAGNOSIS — Z1231 Encounter for screening mammogram for malignant neoplasm of breast: Secondary | ICD-10-CM | POA: Diagnosis not present

## 2020-05-02 DIAGNOSIS — L409 Psoriasis, unspecified: Secondary | ICD-10-CM | POA: Diagnosis not present

## 2020-05-26 DIAGNOSIS — M1711 Unilateral primary osteoarthritis, right knee: Secondary | ICD-10-CM | POA: Diagnosis not present

## 2020-05-30 ENCOUNTER — Ambulatory Visit: Payer: Medicare Other | Admitting: Nurse Practitioner

## 2020-05-30 DIAGNOSIS — F4323 Adjustment disorder with mixed anxiety and depressed mood: Secondary | ICD-10-CM | POA: Diagnosis not present

## 2020-06-12 DIAGNOSIS — L409 Psoriasis, unspecified: Secondary | ICD-10-CM | POA: Diagnosis not present

## 2020-06-12 DIAGNOSIS — L820 Inflamed seborrheic keratosis: Secondary | ICD-10-CM | POA: Diagnosis not present

## 2020-06-13 ENCOUNTER — Encounter: Payer: Self-pay | Admitting: Nurse Practitioner

## 2020-06-13 ENCOUNTER — Other Ambulatory Visit: Payer: Self-pay

## 2020-06-13 ENCOUNTER — Ambulatory Visit (INDEPENDENT_AMBULATORY_CARE_PROVIDER_SITE_OTHER): Payer: Medicare PPO | Admitting: Nurse Practitioner

## 2020-06-13 VITALS — BP 138/82 | HR 68 | Ht 65.0 in | Wt 156.0 lb

## 2020-06-13 DIAGNOSIS — L900 Lichen sclerosus et atrophicus: Secondary | ICD-10-CM

## 2020-06-13 DIAGNOSIS — Z780 Asymptomatic menopausal state: Secondary | ICD-10-CM

## 2020-06-13 DIAGNOSIS — Z01419 Encounter for gynecological examination (general) (routine) without abnormal findings: Secondary | ICD-10-CM | POA: Diagnosis not present

## 2020-06-13 DIAGNOSIS — M8589 Other specified disorders of bone density and structure, multiple sites: Secondary | ICD-10-CM

## 2020-06-13 DIAGNOSIS — N952 Postmenopausal atrophic vaginitis: Secondary | ICD-10-CM

## 2020-06-13 NOTE — Patient Instructions (Signed)

## 2020-06-13 NOTE — Progress Notes (Signed)
   Kim Ferrell June 29, 1945 [id number]   History:  75 y.o. G2P2002 presents for breast and pelvic exam. Hysterectomy many years go for fibroids. Using vaginal estrogen cream for atrophic vaginitis but does not use weekly. Normal pap and mammogram history. Osteopenia. Lichen sclerosis managed well with clobetasol cream, although she currently has itching that started last night after getting in a hot tub.   Gynecologic History Patient's last menstrual period was 04/02/1987.   Contraception/Family planning: status post hysterectomy  Health Maintenance Last Pap: No longer screening per guidelines Last mammogram: 2022. Results were: normal Last colonoscopy: 2018 Last Dexa: 10/2017. Results were: T-score -1.8, FRAX 12% / 2.3%  Past medical history, past surgical history, family history and social history were all reviewed and documented in the EPIC chart.  ROS:  A ROS was performed and pertinent positives and negatives are included.  Exam:  Vitals:   06/13/20 1006  BP: 138/82  Pulse: 68  Weight: 156 lb (70.8 kg)  Height: 5\' 5"  (1.651 m)   Body mass index is 25.96 kg/m.  General appearance:  Normal Thyroid:  Symmetrical, normal in size, without palpable masses or nodularity. Respiratory  Auscultation:  Clear without wheezing or rhonchi Cardiovascular  Auscultation:  Regular rate, without rubs, murmurs or gallops  Edema/varicosities:  Not grossly evident Abdominal  Soft,nontender, without masses, guarding or rebound.  Liver/spleen:  No organomegaly noted  Hernia:  None appreciated  Skin  Inspection:  Grossly normal   Breasts: Examined lying and sitting.   Right: Without masses, retractions, discharge or axillary adenopathy.   Left: Without masses, retractions, discharge or axillary adenopathy. Gentitourinary   Inguinal/mons:  Normal without inguinal adenopathy  External genitalia:  Thin, pearly appearance consistent with LC. Some redness present  BUS/Urethra/Skene's glands:   Normal  Vagina:  Normal  Cervix:  Absent  Uterus:  Absent  Adnexa/parametria:     Rt: Without masses or tenderness.   Lt: Without masses or tenderness.  Anus and perineum: Normal  Digital rectal exam: Normal sphincter tone without palpated masses or tenderness  Assessment/Plan:  75 y.o. 60 for breast and pelvic exam.   Well female exam with routine gynecological exam - Education provided on SBEs, importance of preventative screenings, current guidelines, high calcium diet, regular exercise, and multivitamin daily. Labs with PCP.   Osteopenia of multiple sites - Plan: DG Bone Density. 10/2017 T-score -1.8. Recommend daily vitamin D supplement and regular exercise and repeating DEXA.   Postmenopausal atrophic vaginitis - uses vaginal estrogen cream occasionally.   Lichen sclerosus - managed well with Clobetasol cream as needed. She currently has itching that started last night after getting in a hot tub.   Postmenopausal - Plan: DG Bone Density  Screening for cervical cancer - Normal Pap history.  No longer screening per guidelines.  Screening for breast cancer - Normal mammogram history.  Continue annual screenings.  Normal breast exam today.  Screening for colon cancer - 2018 colonoscopy. Will repeat at GI's recommended interval.   Return in 2 year for breast and pelvic exam.   2019 DNP, 10:24 AM 06/13/2020

## 2020-07-05 DIAGNOSIS — F4323 Adjustment disorder with mixed anxiety and depressed mood: Secondary | ICD-10-CM | POA: Diagnosis not present

## 2020-07-06 DIAGNOSIS — M25561 Pain in right knee: Secondary | ICD-10-CM | POA: Diagnosis not present

## 2020-07-07 DIAGNOSIS — J45909 Unspecified asthma, uncomplicated: Secondary | ICD-10-CM | POA: Diagnosis not present

## 2020-07-07 DIAGNOSIS — Z1389 Encounter for screening for other disorder: Secondary | ICD-10-CM | POA: Diagnosis not present

## 2020-07-07 DIAGNOSIS — F329 Major depressive disorder, single episode, unspecified: Secondary | ICD-10-CM | POA: Diagnosis not present

## 2020-07-07 DIAGNOSIS — M8588 Other specified disorders of bone density and structure, other site: Secondary | ICD-10-CM | POA: Diagnosis not present

## 2020-07-07 DIAGNOSIS — K219 Gastro-esophageal reflux disease without esophagitis: Secondary | ICD-10-CM | POA: Diagnosis not present

## 2020-07-07 DIAGNOSIS — I10 Essential (primary) hypertension: Secondary | ICD-10-CM | POA: Diagnosis not present

## 2020-07-07 DIAGNOSIS — L409 Psoriasis, unspecified: Secondary | ICD-10-CM | POA: Diagnosis not present

## 2020-07-07 DIAGNOSIS — E7800 Pure hypercholesterolemia, unspecified: Secondary | ICD-10-CM | POA: Diagnosis not present

## 2020-07-07 DIAGNOSIS — Z0000 Encounter for general adult medical examination without abnormal findings: Secondary | ICD-10-CM | POA: Diagnosis not present

## 2020-07-18 ENCOUNTER — Other Ambulatory Visit: Payer: Self-pay | Admitting: Nurse Practitioner

## 2020-07-18 ENCOUNTER — Other Ambulatory Visit: Payer: Self-pay

## 2020-07-18 ENCOUNTER — Ambulatory Visit (INDEPENDENT_AMBULATORY_CARE_PROVIDER_SITE_OTHER): Payer: Medicare PPO

## 2020-07-18 DIAGNOSIS — M8589 Other specified disorders of bone density and structure, multiple sites: Secondary | ICD-10-CM

## 2020-07-18 DIAGNOSIS — Z780 Asymptomatic menopausal state: Secondary | ICD-10-CM

## 2020-07-18 DIAGNOSIS — M25561 Pain in right knee: Secondary | ICD-10-CM | POA: Diagnosis not present

## 2020-07-19 DIAGNOSIS — M23229 Derangement of posterior horn of medial meniscus due to old tear or injury, unspecified knee: Secondary | ICD-10-CM | POA: Diagnosis not present

## 2020-07-25 DIAGNOSIS — S83241A Other tear of medial meniscus, current injury, right knee, initial encounter: Secondary | ICD-10-CM | POA: Diagnosis not present

## 2020-08-02 DIAGNOSIS — L400 Psoriasis vulgaris: Secondary | ICD-10-CM | POA: Diagnosis not present

## 2020-08-02 DIAGNOSIS — L820 Inflamed seborrheic keratosis: Secondary | ICD-10-CM | POA: Diagnosis not present

## 2020-08-02 DIAGNOSIS — L821 Other seborrheic keratosis: Secondary | ICD-10-CM | POA: Diagnosis not present

## 2020-08-02 DIAGNOSIS — D485 Neoplasm of uncertain behavior of skin: Secondary | ICD-10-CM | POA: Diagnosis not present

## 2020-08-02 DIAGNOSIS — Z79899 Other long term (current) drug therapy: Secondary | ICD-10-CM | POA: Diagnosis not present

## 2020-08-02 DIAGNOSIS — L570 Actinic keratosis: Secondary | ICD-10-CM | POA: Diagnosis not present

## 2020-08-17 DIAGNOSIS — G8918 Other acute postprocedural pain: Secondary | ICD-10-CM | POA: Diagnosis not present

## 2020-08-17 DIAGNOSIS — M23303 Other meniscus derangements, unspecified medial meniscus, right knee: Secondary | ICD-10-CM | POA: Diagnosis not present

## 2020-08-17 DIAGNOSIS — S83231A Complex tear of medial meniscus, current injury, right knee, initial encounter: Secondary | ICD-10-CM | POA: Diagnosis not present

## 2020-08-17 DIAGNOSIS — M2241 Chondromalacia patellae, right knee: Secondary | ICD-10-CM | POA: Diagnosis not present

## 2020-08-24 DIAGNOSIS — M25561 Pain in right knee: Secondary | ICD-10-CM | POA: Diagnosis not present

## 2020-08-24 DIAGNOSIS — Z9889 Other specified postprocedural states: Secondary | ICD-10-CM | POA: Diagnosis not present

## 2020-08-24 DIAGNOSIS — R2689 Other abnormalities of gait and mobility: Secondary | ICD-10-CM | POA: Diagnosis not present

## 2020-08-24 DIAGNOSIS — R531 Weakness: Secondary | ICD-10-CM | POA: Diagnosis not present

## 2020-08-25 DIAGNOSIS — R531 Weakness: Secondary | ICD-10-CM | POA: Diagnosis not present

## 2020-08-25 DIAGNOSIS — Z9889 Other specified postprocedural states: Secondary | ICD-10-CM | POA: Diagnosis not present

## 2020-08-25 DIAGNOSIS — R2689 Other abnormalities of gait and mobility: Secondary | ICD-10-CM | POA: Diagnosis not present

## 2020-08-25 DIAGNOSIS — M25561 Pain in right knee: Secondary | ICD-10-CM | POA: Diagnosis not present

## 2020-08-30 DIAGNOSIS — M25561 Pain in right knee: Secondary | ICD-10-CM | POA: Diagnosis not present

## 2020-09-06 DIAGNOSIS — M25561 Pain in right knee: Secondary | ICD-10-CM | POA: Diagnosis not present

## 2020-09-06 DIAGNOSIS — F4323 Adjustment disorder with mixed anxiety and depressed mood: Secondary | ICD-10-CM | POA: Diagnosis not present

## 2020-09-06 DIAGNOSIS — Z9889 Other specified postprocedural states: Secondary | ICD-10-CM | POA: Diagnosis not present

## 2020-09-06 DIAGNOSIS — R2689 Other abnormalities of gait and mobility: Secondary | ICD-10-CM | POA: Diagnosis not present

## 2020-09-06 DIAGNOSIS — R531 Weakness: Secondary | ICD-10-CM | POA: Diagnosis not present

## 2020-09-08 DIAGNOSIS — Z9889 Other specified postprocedural states: Secondary | ICD-10-CM | POA: Diagnosis not present

## 2020-09-08 DIAGNOSIS — R2689 Other abnormalities of gait and mobility: Secondary | ICD-10-CM | POA: Diagnosis not present

## 2020-09-08 DIAGNOSIS — M25561 Pain in right knee: Secondary | ICD-10-CM | POA: Diagnosis not present

## 2020-09-08 DIAGNOSIS — R531 Weakness: Secondary | ICD-10-CM | POA: Diagnosis not present

## 2020-09-11 ENCOUNTER — Ambulatory Visit (INDEPENDENT_AMBULATORY_CARE_PROVIDER_SITE_OTHER): Payer: Medicare PPO | Admitting: Obstetrics & Gynecology

## 2020-09-11 ENCOUNTER — Encounter: Payer: Self-pay | Admitting: Obstetrics & Gynecology

## 2020-09-11 ENCOUNTER — Other Ambulatory Visit: Payer: Self-pay

## 2020-09-11 VITALS — BP 132/72

## 2020-09-11 DIAGNOSIS — M8589 Other specified disorders of bone density and structure, multiple sites: Secondary | ICD-10-CM

## 2020-09-11 NOTE — Progress Notes (Signed)
    Kim Ferrell [birth year] [id number]        75 y.o.  G2P2L2   RP: Counseling on bone density  HPI: History of right tibial fracture 9 years ago after a fall down steps.  No other bone fracture.  Good balance.  Bone density done April 2022 showed osteopenia only with a T score of -1.9 at the right femoral neck.  The FRAX was at -3.2% for the 10-year risk of hip fracture.  Patient just had an arthroscopy for the right meniscus 3 weeks ago.  Very active physically.   OB History  Gravida Para Term Preterm AB Living  2 2 2     2   SAB IAB Ectopic Multiple Live Births               # Outcome Date GA Lbr Len/2nd Weight Sex Delivery Anes PTL Lv  2 Term           1 Term             Past medical history,surgical history, problem list, medications, allergies, family history and social history were all reviewed and documented in the EPIC chart.   Directed ROS with pertinent positives and negatives documented in the history of present illness/assessment and plan.  Exam:  Vitals:   09/11/20 1610  BP: 132/72   General appearance:  Normal  Bone density July 18, 2020: Right femoral neck T score of -1.9.  FRAX was at 13% for overall 10-year risk of fracture and at 3.2% for the 10-year risk of hip fracture.  History of right tibial fracture after a fall down steps at age 63.  Significant bone loss at the left hip.   Assessment/Plan:  75 y.o. G2P2002   1. Osteopenia of multiple sites Bone density resolved thoroughly reviewed.  Patient explained her results showing osteopenia with a mild increase risk of hip fracture in the next 10 years at 3.2% with history of right tibial fracture after a significant fall down steps.  Decision not to start on bone medication.  Patient will optimize her vitamin D and calcium intake at 1.5 g/day total.  We will also continue to do weightbearing physical activities on a regular basis.  Will repeat a bone density at 2 years.  Other orders - ergocalciferol  (VITAMIN D2) 1.25 MG (50000 UT) capsule; 1 capsule   61 MD, 4:43 PM 09/11/2020

## 2020-09-13 DIAGNOSIS — M25561 Pain in right knee: Secondary | ICD-10-CM | POA: Diagnosis not present

## 2020-09-13 DIAGNOSIS — R2689 Other abnormalities of gait and mobility: Secondary | ICD-10-CM | POA: Diagnosis not present

## 2020-09-13 DIAGNOSIS — R531 Weakness: Secondary | ICD-10-CM | POA: Diagnosis not present

## 2020-09-13 DIAGNOSIS — Z9889 Other specified postprocedural states: Secondary | ICD-10-CM | POA: Diagnosis not present

## 2020-09-18 DIAGNOSIS — Z9889 Other specified postprocedural states: Secondary | ICD-10-CM | POA: Diagnosis not present

## 2020-09-18 DIAGNOSIS — R531 Weakness: Secondary | ICD-10-CM | POA: Diagnosis not present

## 2020-09-18 DIAGNOSIS — R2689 Other abnormalities of gait and mobility: Secondary | ICD-10-CM | POA: Diagnosis not present

## 2020-09-18 DIAGNOSIS — M25561 Pain in right knee: Secondary | ICD-10-CM | POA: Diagnosis not present

## 2020-09-20 DIAGNOSIS — L820 Inflamed seborrheic keratosis: Secondary | ICD-10-CM | POA: Diagnosis not present

## 2020-09-20 DIAGNOSIS — L570 Actinic keratosis: Secondary | ICD-10-CM | POA: Diagnosis not present

## 2020-09-20 DIAGNOSIS — L810 Postinflammatory hyperpigmentation: Secondary | ICD-10-CM | POA: Diagnosis not present

## 2020-09-20 DIAGNOSIS — L400 Psoriasis vulgaris: Secondary | ICD-10-CM | POA: Diagnosis not present

## 2020-09-21 DIAGNOSIS — Z9889 Other specified postprocedural states: Secondary | ICD-10-CM | POA: Diagnosis not present

## 2020-09-21 DIAGNOSIS — R531 Weakness: Secondary | ICD-10-CM | POA: Diagnosis not present

## 2020-09-21 DIAGNOSIS — M25561 Pain in right knee: Secondary | ICD-10-CM | POA: Diagnosis not present

## 2020-09-21 DIAGNOSIS — R2689 Other abnormalities of gait and mobility: Secondary | ICD-10-CM | POA: Diagnosis not present

## 2020-10-03 DIAGNOSIS — F4323 Adjustment disorder with mixed anxiety and depressed mood: Secondary | ICD-10-CM | POA: Diagnosis not present

## 2020-10-04 DIAGNOSIS — M25561 Pain in right knee: Secondary | ICD-10-CM | POA: Diagnosis not present

## 2020-10-04 DIAGNOSIS — R531 Weakness: Secondary | ICD-10-CM | POA: Diagnosis not present

## 2020-10-04 DIAGNOSIS — R2689 Other abnormalities of gait and mobility: Secondary | ICD-10-CM | POA: Diagnosis not present

## 2020-10-04 DIAGNOSIS — Z9889 Other specified postprocedural states: Secondary | ICD-10-CM | POA: Diagnosis not present

## 2020-11-14 DIAGNOSIS — F4323 Adjustment disorder with mixed anxiety and depressed mood: Secondary | ICD-10-CM | POA: Diagnosis not present

## 2020-11-16 ENCOUNTER — Telehealth: Payer: Self-pay

## 2020-11-16 DIAGNOSIS — N952 Postmenopausal atrophic vaginitis: Secondary | ICD-10-CM

## 2020-11-16 MED ORDER — NONFORMULARY OR COMPOUNDED ITEM: 2 refills | Status: DC

## 2020-11-16 NOTE — Telephone Encounter (Signed)
AEX with TW 06/13/20.

## 2020-11-16 NOTE — Telephone Encounter (Signed)
Prescription signed. Thank you.

## 2020-11-16 NOTE — Telephone Encounter (Signed)
Patient called because in the past Dr. Audie Box had prescribed compounded Estradiol 0.02% that she uses 1 gram twice weekly for her to use in conjunction with the Clobetasol Cream to treat lichen sclerosus.  She said that Rx is > 76 year old and she cannot request refill. She is asking [place] to send Rx to [facility] for that Rx. ([facility] is still compounding it.)

## 2020-11-16 NOTE — Telephone Encounter (Signed)
Rx called into pharmacy. Patient notified.

## 2020-11-21 DIAGNOSIS — M8588 Other specified disorders of bone density and structure, other site: Secondary | ICD-10-CM | POA: Diagnosis not present

## 2020-12-06 DIAGNOSIS — F419 Anxiety disorder, unspecified: Secondary | ICD-10-CM | POA: Diagnosis not present

## 2020-12-06 DIAGNOSIS — I10 Essential (primary) hypertension: Secondary | ICD-10-CM | POA: Diagnosis not present

## 2020-12-06 DIAGNOSIS — I739 Peripheral vascular disease, unspecified: Secondary | ICD-10-CM | POA: Diagnosis not present

## 2020-12-06 DIAGNOSIS — L4050 Arthropathic psoriasis, unspecified: Secondary | ICD-10-CM | POA: Diagnosis not present

## 2020-12-06 DIAGNOSIS — J383 Other diseases of vocal cords: Secondary | ICD-10-CM | POA: Diagnosis not present

## 2020-12-06 DIAGNOSIS — K219 Gastro-esophageal reflux disease without esophagitis: Secondary | ICD-10-CM | POA: Diagnosis not present

## 2020-12-06 DIAGNOSIS — F329 Major depressive disorder, single episode, unspecified: Secondary | ICD-10-CM | POA: Diagnosis not present

## 2020-12-06 DIAGNOSIS — F439 Reaction to severe stress, unspecified: Secondary | ICD-10-CM | POA: Diagnosis not present

## 2020-12-06 DIAGNOSIS — J45909 Unspecified asthma, uncomplicated: Secondary | ICD-10-CM | POA: Diagnosis not present

## 2020-12-13 DIAGNOSIS — H5213 Myopia, bilateral: Secondary | ICD-10-CM | POA: Diagnosis not present

## 2020-12-13 DIAGNOSIS — H1789 Other corneal scars and opacities: Secondary | ICD-10-CM | POA: Diagnosis not present

## 2020-12-19 DIAGNOSIS — F339 Major depressive disorder, recurrent, unspecified: Secondary | ICD-10-CM | POA: Diagnosis not present

## 2020-12-19 DIAGNOSIS — F4323 Adjustment disorder with mixed anxiety and depressed mood: Secondary | ICD-10-CM | POA: Diagnosis not present

## 2020-12-19 DIAGNOSIS — F418 Other specified anxiety disorders: Secondary | ICD-10-CM | POA: Diagnosis not present

## 2020-12-19 DIAGNOSIS — I10 Essential (primary) hypertension: Secondary | ICD-10-CM | POA: Diagnosis not present

## 2020-12-19 DIAGNOSIS — R5383 Other fatigue: Secondary | ICD-10-CM | POA: Diagnosis not present

## 2021-01-08 DIAGNOSIS — L821 Other seborrheic keratosis: Secondary | ICD-10-CM | POA: Diagnosis not present

## 2021-01-08 DIAGNOSIS — F339 Major depressive disorder, recurrent, unspecified: Secondary | ICD-10-CM | POA: Diagnosis not present

## 2021-01-08 DIAGNOSIS — Z23 Encounter for immunization: Secondary | ICD-10-CM | POA: Diagnosis not present

## 2021-01-08 DIAGNOSIS — D692 Other nonthrombocytopenic purpura: Secondary | ICD-10-CM | POA: Diagnosis not present

## 2021-01-08 DIAGNOSIS — R42 Dizziness and giddiness: Secondary | ICD-10-CM | POA: Diagnosis not present

## 2021-01-08 DIAGNOSIS — L570 Actinic keratosis: Secondary | ICD-10-CM | POA: Diagnosis not present

## 2021-01-08 DIAGNOSIS — L718 Other rosacea: Secondary | ICD-10-CM | POA: Diagnosis not present

## 2021-01-08 DIAGNOSIS — E7800 Pure hypercholesterolemia, unspecified: Secondary | ICD-10-CM | POA: Diagnosis not present

## 2021-01-08 DIAGNOSIS — I10 Essential (primary) hypertension: Secondary | ICD-10-CM | POA: Diagnosis not present

## 2021-01-15 DIAGNOSIS — F4323 Adjustment disorder with mixed anxiety and depressed mood: Secondary | ICD-10-CM | POA: Diagnosis not present

## 2021-02-08 ENCOUNTER — Ambulatory Visit: Payer: Self-pay | Admitting: Adult Health

## 2021-02-09 DIAGNOSIS — Z62821 Parent-adopted child conflict: Secondary | ICD-10-CM | POA: Diagnosis not present

## 2021-02-09 DIAGNOSIS — F332 Major depressive disorder, recurrent severe without psychotic features: Secondary | ICD-10-CM | POA: Diagnosis not present

## 2021-02-09 DIAGNOSIS — F411 Generalized anxiety disorder: Secondary | ICD-10-CM | POA: Diagnosis not present

## 2021-02-16 DIAGNOSIS — F4323 Adjustment disorder with mixed anxiety and depressed mood: Secondary | ICD-10-CM | POA: Diagnosis not present

## 2021-02-19 DIAGNOSIS — Z111 Encounter for screening for respiratory tuberculosis: Secondary | ICD-10-CM | POA: Diagnosis not present

## 2021-03-02 DIAGNOSIS — F411 Generalized anxiety disorder: Secondary | ICD-10-CM | POA: Diagnosis not present

## 2021-03-02 DIAGNOSIS — Z62821 Parent-adopted child conflict: Secondary | ICD-10-CM | POA: Diagnosis not present

## 2021-03-02 DIAGNOSIS — F332 Major depressive disorder, recurrent severe without psychotic features: Secondary | ICD-10-CM | POA: Diagnosis not present

## 2021-03-12 DIAGNOSIS — F4323 Adjustment disorder with mixed anxiety and depressed mood: Secondary | ICD-10-CM | POA: Diagnosis not present

## 2021-04-11 DIAGNOSIS — F4323 Adjustment disorder with mixed anxiety and depressed mood: Secondary | ICD-10-CM | POA: Diagnosis not present

## 2021-04-14 ENCOUNTER — Encounter (HOSPITAL_BASED_OUTPATIENT_CLINIC_OR_DEPARTMENT_OTHER): Payer: Self-pay | Admitting: Emergency Medicine

## 2021-04-14 ENCOUNTER — Emergency Department (HOSPITAL_BASED_OUTPATIENT_CLINIC_OR_DEPARTMENT_OTHER)
Admission: EM | Admit: 2021-04-14 | Discharge: 2021-04-14 | Disposition: A | Discharge status: Home / Self Care | Payer: Medicare PPO | Attending: Emergency Medicine | Admitting: Emergency Medicine

## 2021-04-14 ENCOUNTER — Other Ambulatory Visit: Payer: Self-pay

## 2021-04-14 DIAGNOSIS — K6289 Other specified diseases of anus and rectum: Secondary | ICD-10-CM | POA: Diagnosis present

## 2021-04-14 DIAGNOSIS — K644 Residual hemorrhoidal skin tags: Secondary | ICD-10-CM | POA: Insufficient documentation

## 2021-04-14 DIAGNOSIS — K649 Unspecified hemorrhoids: Secondary | ICD-10-CM | POA: Diagnosis not present

## 2021-04-14 DIAGNOSIS — K5900 Constipation, unspecified: Secondary | ICD-10-CM | POA: Diagnosis not present

## 2021-04-14 MED ORDER — HYDROCORTISONE (PERIANAL) 2.5 % EX CREA: 1.0000 "application " | TOPICAL_CREAM | Freq: Two times a day (BID) | CUTANEOUS | 0 refills | Status: AC

## 2021-04-14 MED ORDER — LIDOCAINE HCL URETHRAL/MUCOSAL 2 % EX GEL
1.0000 "application " | Freq: Once | CUTANEOUS | Status: AC
  Administered 2021-04-14: 1
  Filled 2021-04-14: qty 11

## 2021-04-14 NOTE — ED Notes (Signed)
Chaperoned physician during rectal exam.  Large external hemorrhoids visualized.  Patient found to be incontinent of pasty brown stool prior to the exam and assisted with pericare and new incontinence brief applied.

## 2021-04-14 NOTE — ED Provider Notes (Signed)
[facility] EMERGENCY DEPARTMENT Provider Note   CSN: [id number] Arrival date & time: 04/14/21  1726     History  Chief Complaint  Patient presents with   Constipation   Rectal Pain    Kim Ferrell is a 77 y.o. female.  She is complaining of severe rectal pain that started today when she was trying to have a bowel movement.  She has chronic hemorrhoids and they became acutely inflamed.  Last bowel movement was 4 days ago.  She said she is passing watery brown diarrhea today.  Minimal abdominal pain.  No fevers or chills.  No blood.  The history is provided by the patient.  Constipation Severity:  Moderate Time since last bowel movement:  4 days Progression:  Unchanged Chronicity:  New Stool description:  Watery Relieved by:  None tried Worsened by:  Nothing Ineffective treatments:  None tried Associated symptoms: abdominal pain   Associated symptoms: no dysuria, no fever, no nausea, no urinary retention and no vomiting       Home Medications Prior to Admission medications   Medication Sig Start Date End Date Taking? Authorizing Provider  Adalimumab (HUMIRA ) Inject into the skin.    [provider]  albuterol (PROVENTIL HFA;VENTOLIN HFA) 108 (90 BASE) MCG/ACT inhaler Inhale 2 puffs into the lungs every 6 (six) hours as needed for shortness of breath.    [provider]  ALPRAZolam Prudy Feeler) 0.25 MG tablet 1 tablet 12/10/19   [provider]  buPROPion (WELLBUTRIN SR) 100 MG 12 hr tablet Take 100 mg by mouth daily.    [provider]  cetirizine (ZYRTEC) 10 MG tablet Take 10 mg by mouth daily.    [provider]  clobetasol cream (TEMOVATE) 0.05 % Apply 1 application topically every evening. As needed for irritation 10/14/17   Fontaine, Nadyne Coombes, MD  ergocalciferol (VITAMIN D2) 1.25 MG (50000 UT) capsule 1 capsule 07/11/20   [provider]  fluticasone (FLONASE) 50 MCG/ACT nasal spray Place 2 sprays into the nose  daily as needed for rhinitis.    [provider]  halobetasol (ULTRAVATE) 0.05 % cream Apply 1 application topically daily.     [provider]  losartan-hydrochlorothiazide (HYZAAR) 100-25 MG per tablet Take 1 tablet by mouth daily.    [provider]  NONFORMULARY OR COMPOUNDED ITEM Estradiol vaginal cream 0.02% Vaginal Cream S: Insert 1 gram intravaginally twice weekly. 11/16/20   Olivia Mackie, NP  omeprazole (PRILOSEC) 20 MG capsule Take 20 mg by mouth 2 (two) times daily.    [provider]  PARoxetine (PAXIL) 10 MG tablet Take 10 mg by mouth every morning.    [provider]  polyethylene glycol (MIRALAX / GLYCOLAX) packet Take 17 g by mouth daily as needed (Constipation).    [provider]  SUMAtriptan (IMITREX) 100 MG tablet Take 100 mg by mouth every 2 (two) hours as needed for migraine. For headache    [provider]      Allergies    Codeine, Adhesive [tape], Gold-containing drug products, Neomycin-bacitracin zn-polymyx, Nickel, and Silver    Review of Systems   Review of Systems  Constitutional:  Negative for fever.  HENT:  Negative for sore throat.   Eyes:  Negative for visual disturbance.  Respiratory:  Negative for shortness of breath.   Cardiovascular:  Negative for chest pain.  Gastrointestinal:  Positive for abdominal pain, constipation and rectal pain. Negative for blood in stool, nausea and vomiting.  Genitourinary:  Negative for dysuria.  Musculoskeletal:  Negative for neck pain.  Skin:  Negative for rash.  Neurological:  Negative for headaches.   Physical Exam Updated Vital Signs BP 140/82    Pulse 100    Temp 98.9 F (37.2 C) (Oral)    Resp 18    Ht 5\' 5"  (1.651 m)    Wt 70.8 kg    LMP 04/02/1987    SpO2 96%    BMI 25.96 kg/m  Physical Exam Vitals and nursing note reviewed.  Constitutional:      General: She is not in acute distress.    Appearance: Normal appearance. She is well-developed.   HENT:     Head: Normocephalic and atraumatic.  Eyes:     Conjunctiva/sclera: Conjunctivae normal.  Cardiovascular:     Rate and Rhythm: Normal rate and regular rhythm.     Heart sounds: No murmur heard. Pulmonary:     Effort: Pulmonary effort is normal. No respiratory distress.     Breath sounds: Normal breath sounds.  Abdominal:     Palpations: Abdomen is soft.     Tenderness: There is no abdominal tenderness. There is no guarding or rebound.  Genitourinary:    Comments: Rectal exam done with nurse Amber as chaperone.  She has multiple tender soft nonthrombosed hemorrhoids externally.  Normal tone no stool in vault. Musculoskeletal:        General: No swelling.     Cervical back: Neck supple.  Skin:    General: Skin is warm and dry.     Capillary Refill: Capillary refill takes less than 2 seconds.  Neurological:     General: No focal deficit present.     Mental Status: She is alert.  Psychiatric:        Mood and Affect: Mood normal.    ED Results / Procedures / Treatments   Labs (all labs ordered are listed, but only abnormal results are displayed) Labs Reviewed - No data to display  EKG None  Radiology No results found.  Procedures Procedures    Medications Ordered in ED Medications  lidocaine (XYLOCAINE) 2 % jelly 1 application (1 application Other Given 04/14/21 2036)    ED Course/ Medical Decision Making/ A&P Clinical Course as of 04/15/21 1008  Sat Apr 14, 2021  2146 Patient had moderate success with enema.  She is comfortable plan for discharge.  Will prescribe her some hemorrhoid cream. [MB]    Clinical Course User Index [MB] Hayden Rasmussen, MD                           Medical Decision Making  76 year old female here with rectal pain in the setting of constipation.  Patient has multiple tender nonthrombosed hemorrhoids.  No evidence of fecal impaction.  She was given topical anesthetic and soapsuds enema with moderate stool results.  Will cover  with Anusol HC for hemorrhoids and recommended increased fiber and fluids.  Recommended close follow-up with PCP.  Patient and husband comfortable with plan.        Final Clinical Impression(s) / ED Diagnoses Final diagnoses:  Hemorrhoids, unspecified hemorrhoid type  Constipation, unspecified constipation type    Rx / DC Orders ED Discharge Orders          Ordered    hydrocortisone (ANUSOL-HC) 2.5 % rectal cream  2 times daily        04/14/21 2151  Hayden Rasmussen, MD 04/15/21 1009

## 2021-04-14 NOTE — ED Notes (Signed)
Patient discharged to home.  All discharge instructions reviewed.  Patient verbalized understanding via teachback method.  VS WDL.  Respirations even and unlabored.  Ambulatory out of ED.   °

## 2021-04-14 NOTE — ED Triage Notes (Signed)
Pt arrives pov, to triage in wheelchair, c/o hemorrhoids with increased swelling, inability to pass stool. Last BM x 4 days pta. Also c/o abdominal tenderness

## 2021-04-14 NOTE — Discharge Instructions (Addendum)
You were seen in the emergency department for evaluation of rectal pain and constipation.  You had multiple large hemorrhoids.  Your symptoms improved with some topical numbing medicine and an enema.  Please continue to drink plenty of fluids and increase your fiber intake.  Topical anti-inflammatory cream for your hemorrhoids.  Follow-up with your primary care doctor.  Return to the emergency department if any worsening or concerning symptoms.

## 2021-04-19 DIAGNOSIS — K644 Residual hemorrhoidal skin tags: Secondary | ICD-10-CM | POA: Diagnosis not present

## 2021-05-04 DIAGNOSIS — R195 Other fecal abnormalities: Secondary | ICD-10-CM | POA: Diagnosis not present

## 2021-05-04 DIAGNOSIS — K644 Residual hemorrhoidal skin tags: Secondary | ICD-10-CM | POA: Diagnosis not present

## 2021-05-09 DIAGNOSIS — F4323 Adjustment disorder with mixed anxiety and depressed mood: Secondary | ICD-10-CM | POA: Diagnosis not present

## 2021-05-17 DIAGNOSIS — Z1231 Encounter for screening mammogram for malignant neoplasm of breast: Secondary | ICD-10-CM | POA: Diagnosis not present

## 2021-05-29 DIAGNOSIS — F4323 Adjustment disorder with mixed anxiety and depressed mood: Secondary | ICD-10-CM | POA: Diagnosis not present

## 2021-06-04 DIAGNOSIS — K5900 Constipation, unspecified: Secondary | ICD-10-CM | POA: Diagnosis not present

## 2021-06-06 DIAGNOSIS — M792 Neuralgia and neuritis, unspecified: Secondary | ICD-10-CM | POA: Diagnosis not present

## 2021-06-06 DIAGNOSIS — B351 Tinea unguium: Secondary | ICD-10-CM | POA: Diagnosis not present

## 2021-06-18 DIAGNOSIS — Z20822 Contact with and (suspected) exposure to covid-19: Secondary | ICD-10-CM | POA: Diagnosis not present

## 2021-07-03 DIAGNOSIS — F4323 Adjustment disorder with mixed anxiety and depressed mood: Secondary | ICD-10-CM | POA: Diagnosis not present

## 2021-07-09 DIAGNOSIS — Z1389 Encounter for screening for other disorder: Secondary | ICD-10-CM | POA: Diagnosis not present

## 2021-07-09 DIAGNOSIS — I10 Essential (primary) hypertension: Secondary | ICD-10-CM | POA: Diagnosis not present

## 2021-07-09 DIAGNOSIS — J301 Allergic rhinitis due to pollen: Secondary | ICD-10-CM | POA: Diagnosis not present

## 2021-07-09 DIAGNOSIS — L409 Psoriasis, unspecified: Secondary | ICD-10-CM | POA: Diagnosis not present

## 2021-07-09 DIAGNOSIS — Z0000 Encounter for general adult medical examination without abnormal findings: Secondary | ICD-10-CM | POA: Diagnosis not present

## 2021-07-09 DIAGNOSIS — M189 Osteoarthritis of first carpometacarpal joint, unspecified: Secondary | ICD-10-CM | POA: Diagnosis not present

## 2021-07-09 DIAGNOSIS — F329 Major depressive disorder, single episode, unspecified: Secondary | ICD-10-CM | POA: Diagnosis not present

## 2021-07-09 DIAGNOSIS — G43909 Migraine, unspecified, not intractable, without status migrainosus: Secondary | ICD-10-CM | POA: Diagnosis not present

## 2021-07-09 DIAGNOSIS — R42 Dizziness and giddiness: Secondary | ICD-10-CM | POA: Diagnosis not present

## 2021-07-24 ENCOUNTER — Encounter: Payer: Self-pay | Admitting: Nurse Practitioner

## 2021-07-24 ENCOUNTER — Ambulatory Visit: Payer: Medicare PPO | Admitting: Nurse Practitioner

## 2021-07-24 VITALS — BP 122/74

## 2021-07-24 DIAGNOSIS — L900 Lichen sclerosus et atrophicus: Secondary | ICD-10-CM

## 2021-07-24 DIAGNOSIS — Z62821 Parent-adopted child conflict: Secondary | ICD-10-CM | POA: Diagnosis not present

## 2021-07-24 DIAGNOSIS — N952 Postmenopausal atrophic vaginitis: Secondary | ICD-10-CM

## 2021-07-24 DIAGNOSIS — F332 Major depressive disorder, recurrent severe without psychotic features: Secondary | ICD-10-CM | POA: Diagnosis not present

## 2021-07-24 DIAGNOSIS — F411 Generalized anxiety disorder: Secondary | ICD-10-CM | POA: Diagnosis not present

## 2021-07-24 MED ORDER — CLOBETASOL PROPIONATE 0.05 % EX CREA: 1.0000 "application " | TOPICAL_CREAM | CUTANEOUS | 1 refills | Status: DC

## 2021-07-24 MED ORDER — ESTRADIOL 0.1 MG/GM VA CREA: 1.0000 g | TOPICAL_CREAM | VAGINAL | 1 refills | Status: DC

## 2021-07-24 NOTE — Progress Notes (Signed)
? ?  Acute Office Visit ? ?Subjective:  ? ? Patient ID: Kim Ferrell, female    DOB: [birth year], 76 y.o.   MRN: [id number] ? ? ?HPI ?76 y.o. presents today for vulvar burning and irritation. H/O lichen sclerosus. Uses Clobetasol ointment about once per week but it is expired. She has had burning with compounded estradiol cream and feels it is messier than the Estrace and she would like to switch. Her current prescription is expired. She uses the vaginal estrogen once weekly. Denies discharge or odor. Small amount of blood with wiping.  ? ? ?Review of Systems  ?Constitutional: Negative.   ?Genitourinary:  Positive for vaginal pain (Vulvar burning/irritation). Negative for vaginal discharge.  ? ?   ?Objective:  ?  ?Physical Exam ?Constitutional:   ?   Appearance: Normal appearance.  ?Genitourinary: ?   Comments: Hypopigmentation/pearly appearance with areas of excoriation along vestibule c/w LS. ? ?Atrophic changes ? ? ?BP 122/74   LMP 04/02/1987  ?Wt Readings from Last 3 Encounters:  ?04/14/21 156 lb (70.8 kg)  ?06/13/20 156 lb (70.8 kg)  ?03/17/19 160 lb (72.6 kg)  ? ? ?   ? ?Patient informed chaperone available to be present for breast and pelvic exam. Patient has requested no chaperone to be present. Patient has been advised what will be completed during breast and pelvic exam.  ? ?Assessment & Plan:  ? ?Problem List Items Addressed This Visit   ? ?  ? Other  ? Lichen sclerosus  ? Relevant Medications  ? clobetasol cream (TEMOVATE) 0.05 % (Start on 07/26/2021)  ? ?Other Visit Diagnoses   ? ? Post-menopausal atrophic vaginitis    -  Primary  ? Relevant Medications  ? estradiol (ESTRACE VAGINAL) 0.1 MG/GM vaginal cream (Start on 07/26/2021)  ? ?  ? ?Plan: Exam consistent with lichen sclerosus flare. Recommend using Clobetasol BID x 7 days, then daily x 7 days, then twice weekly. New prescription provided. New prescription also provided for estradiol vaginal cream. Recommend using consistently twice weekly. She is  agreeable to plan.  ? ? ? ? ?Tamela Gammon DNP, 3:23 PM 07/24/2021 ? ?

## 2021-08-01 ENCOUNTER — Ambulatory Visit: Payer: Medicare PPO | Admitting: Neurology

## 2021-08-01 ENCOUNTER — Encounter: Payer: Self-pay | Admitting: Neurology

## 2021-08-01 VITALS — BP 131/94 | HR 80 | Ht 65.0 in | Wt 153.5 lb

## 2021-08-01 DIAGNOSIS — R42 Dizziness and giddiness: Secondary | ICD-10-CM

## 2021-08-01 NOTE — Patient Instructions (Signed)
Continue current medication ?Return as needed ?

## 2021-08-01 NOTE — Progress Notes (Signed)
? ?[facility] ? ?PATIENT: Kim Ferrell ?DOB: [birth year] ? ?REQUESTING CLINICIAN: Maurice Small, MD ?HISTORY FROM: Patient  ?REASON FOR VISIT: Dizziness/Vertigo  ? ? ?HISTORICAL ? ?CHIEF COMPLAINT:  ?Chief Complaint  ?Patient presents with  ? New Patient (Initial Visit)  ?  Rm 15. Alone. ?NP/Paper Proficient/[facility]/Elaine Valentina Lucks MD/vertigo.  ? ? ?HISTORY OF PRESENT ILLNESS:  ?This is a 76 year old woman with past medical history of hypertension, asthma, depression who is presenting with complaint of vertigo and dizziness.  Patient reports having persistent vertigo for the past 67-months, described as room spinning sensation and sometimes unsteadiness.  These episodes can happen when laying down and turning her head from one side, sometimes while sitting down and moving her head and sometimes with walking.  At times she will have the room spinning sensation and sometimes she feels like her eyes are "flipping up".  She did take meclizine without clear benefit and also has been doing the Epley maneuver.  Patient reported 5 days ago her symptoms resolved, she cannot think of anything that she did to fix the symptoms, she is woke up in the morning and was not having any additional symptoms.  She did report having episodic vertigo in the past but nothing that was sustained for 27-months.  She denies any other complaints.   ? ? ?OTHER MEDICAL CONDITIONS: Asthma, Hypertension, Depression  ? ? ?REVIEW OF SYSTEMS: Full 14 system review of systems performed and negative with exception of: as noted in the HPI  ? ?ALLERGIES: ?Allergies  ?Allergen Reactions  ? Codeine   ?  Other reaction(s): itching  ? Adhesive [Tape] Other (See Comments)  ?  brusies  ? Gold-Containing Drug Products Rash  ? Neomycin-Bacitracin Zn-Polymyx Rash and Other (See Comments)  ? Nickel Rash  ? Silver Rash  ? ? ?HOME MEDICATIONS: ?Outpatient Medications Prior to Visit  ?Medication Sig Dispense Refill  ? Adalimumab (HUMIRA )  Inject into the skin.    ? albuterol (PROVENTIL HFA;VENTOLIN HFA) 108 (90 BASE) MCG/ACT inhaler Inhale 2 puffs into the lungs every 6 (six) hours as needed for shortness of breath.    ? ALPRAZolam (XANAX) 0.25 MG tablet 1 tablet    ? buPROPion (WELLBUTRIN SR) 100 MG 12 hr tablet Take 100 mg by mouth daily.    ? cetirizine (ZYRTEC) 10 MG tablet Take 10 mg by mouth daily.    ? clobetasol cream (TEMOVATE) 0.05 % Apply 1 application. topically 2 (two) times a week. Initial dose: Twice daily x 1 week, then daily x 1 week, then twice weekly 60 g 1  ? estradiol (ESTRACE VAGINAL) 0.1 MG/GM vaginal cream Place 1 g vaginally 2 (two) times a week. 42.5 g 1  ? fluticasone (FLONASE) 50 MCG/ACT nasal spray Place 2 sprays into the nose daily as needed for rhinitis.    ? halobetasol (ULTRAVATE) 0.05 % cream Apply 1 application topically daily.     ? hydrocortisone (ANUSOL-HC) 2.5 % rectal cream Place 1 application rectally 2 (two) times daily. 30 g 0  ? losartan-hydrochlorothiazide (HYZAAR) 100-25 MG per tablet Take 1 tablet by mouth daily.    ? NONFORMULARY OR COMPOUNDED ITEM Estradiol vaginal cream 0.02% Vaginal Cream ?S: Insert 1 gram intravaginally twice weekly. 90 each 2  ? omeprazole (PRILOSEC) 20 MG capsule Take 20 mg by mouth 2 (two) times daily.    ? polyethylene glycol (MIRALAX / GLYCOLAX) packet Take 17 g by mouth daily as needed (Constipation).    ? sertraline (ZOLOFT) 100 MG  tablet Take 150 mg by mouth daily.    ? SUMAtriptan (IMITREX) 100 MG tablet Take 100 mg by mouth every 2 (two) hours as needed for migraine. For headache    ? ergocalciferol (VITAMIN D2) 1.25 MG (50000 UT) capsule 1 capsule (Patient not taking: Reported on 07/24/2021)    ? ?No facility-administered medications prior to visit.  ? ? ?PAST MEDICAL HISTORY: ?Past Medical History:  ?Diagnosis Date  ? Anemia   ? hx  ? Arthritis   ? hands, knees  ? Asthma   ? has not needed any med. in 2 yrs.  ? Balance problem   ? Chronic bronchitis (HCC)   ? Dental  crowns present   ? Depression   ? GERD (gastroesophageal reflux disease)   ? Hypertension   ? under control, has been on med. x 3-4 yrs.  ? Lichen sclerosus   ? Migraines   ? Osteopenia 10/2017  ? T score -1.8 FRAX 12% / 2.3% excluding history of lower leg fracture falling downstairs.  ? Pneumonia   ? hx  ? Psoriasis   ? Seasonal allergies   ? Trimalleolar fracture of right ankle 09/16/2011  ? ? ?PAST SURGICAL HISTORY: ?Past Surgical History:  ?Procedure Laterality Date  ? ABDOMINAL HYSTERECTOMY    ? partial  ? ANTERIOR CERVICAL DECOMP/DISCECTOMY FUSION N/A 11/02/2012  ? Procedure: ANTERIOR CERVICAL DECOMPRESSION/DISCECTOMY FUSION  cervical five-six cervical six-seven;  Surgeon: Barnett AbuHenry Elsner, MD;  Location: [facility];  Service: Neurosurgery;  Laterality: N/A;  Cervical five six  Cervical six-seven Anterior cervical decompression/diskectomy/fusion  ? HAND SURGERY    ? rt strep infection  ? KNEE SURGERY    ? ORIF ANKLE FRACTURE  09/17/2011  ? Procedure: OPEN REDUCTION INTERNAL FIXATION (ORIF) ANKLE FRACTURE;  Surgeon: Mable ParisJustin William Chandler, MD;  Location: [facility];  Service: Orthopedics;  Laterality: Right;  ? TONSILLECTOMY    ? ? ?FAMILY HISTORY: ?Family History  ?Problem Relation Age of Onset  ? Diabetes Mother   ? Heart disease Mother   ? Cancer Father   ?     skin  ? Heart disease Father   ? Stroke Sister   ? Heart disease Brother   ? Cancer Brother   ?     Colon,Liver,Prostate  ? ? ?SOCIAL HISTORY: ?Social History  ? ?Socioeconomic History  ? Marital status: Married  ?  Spouse name: Not on file  ? Number of children: Not on file  ? Years of education: Not on file  ? Highest education level: Not on file  ?Occupational History  ? Not on file  ?Tobacco Use  ? Smoking status: Former  ?  Packs/day: 1.00  ?  Years: 5.00  ?  Pack years: 5.00  ?  Types: Cigarettes  ?  Quit date: 10/28/1974  ?  Years since quitting: 46.7  ? Smokeless tobacco: Never  ? Tobacco comments:  ?  quit smoking 36 yrs. ago   ?Vaping Use  ? Vaping Use: Never used  ?Substance and Sexual Activity  ? Alcohol use: Yes  ?  Comment:  3 glasses of wine daily  ? Drug use: No  ? Sexual activity: Not Currently  ?  Birth control/protection: Surgical  ?  Comment: 1st intercourse 76 yo-Fewer than 5 partners  ?Other Topics Concern  ? Not on file  ?Social History Narrative  ? Not on file  ? ?Social Determinants of Health  ? ?Financial Resource Strain: Not on file  ?Food Insecurity: Not on  file  ?Transportation Needs: Not on file  ?Physical Activity: Not on file  ?Stress: Not on file  ?Social Connections: Not on file  ?Intimate Partner Violence: Not on file  ? ? ? ?PHYSICAL EXAM ? ?GENERAL EXAM/CONSTITUTIONAL: ?Vitals:  ?Vitals:  ? 08/01/21 1025 08/01/21 1107 08/01/21 1108  ?BP: 135/83 (!) 145/97 (!) 131/94  ?Pulse: 73 72 80  ?Weight: 153 lb 8 oz (69.6 kg)    ?Height: 5\' 5"  (1.651 m)    ? ?Orthostatic VS for the past 72 hrs (Last 3 readings): ? Patient Position  ?08/01/21 1108 Standing  ?08/01/21 1107 Supine  ? ? ?Body mass index is 25.54 kg/m?. ?Wt Readings from Last 3 Encounters:  ?08/01/21 153 lb 8 oz (69.6 kg)  ?04/14/21 156 lb (70.8 kg)  ?06/13/20 156 lb (70.8 kg)  ? ?Patient is in no distress; well developed, nourished and groomed; neck is supple ? ?EYES: ?Pupils round and reactive to light, Visual fields full to confrontation, Extraocular movements intacts,  ? ?MUSCULOSKELETAL: ?Gait, strength, tone, movements noted in Neurologic exam below ? ?NEUROLOGIC: ?MENTAL STATUS:  ?   ? View : No data to display.  ?  ?  ?  ? ?awake, alert, oriented to person, place and time ?recent and remote memory intact ?normal attention and concentration ?language fluent, comprehension intact, naming intact ?fund of knowledge appropriate ? ?CRANIAL NERVE:  ?2nd, 3rd, 4th, 6th - pupils equal and reactive to light, visual fields full to confrontation, extraocular muscles intact, no nystagmus ?5th - facial sensation symmetric ?7th - facial strength symmetric ?8th -  hearing intact ?9th - palate elevates symmetrically, uvula midline ?11th - shoulder shrug symmetric ?12th - tongue protrusion midline ? ?MOTOR:  ?normal bulk and tone, full strength in the BUE, BLE ? ?SENSORY:  ?norma

## 2021-08-02 DIAGNOSIS — F4323 Adjustment disorder with mixed anxiety and depressed mood: Secondary | ICD-10-CM | POA: Diagnosis not present

## 2021-08-30 DIAGNOSIS — F4323 Adjustment disorder with mixed anxiety and depressed mood: Secondary | ICD-10-CM | POA: Diagnosis not present

## 2021-08-31 DIAGNOSIS — M792 Neuralgia and neuritis, unspecified: Secondary | ICD-10-CM | POA: Diagnosis not present

## 2021-08-31 DIAGNOSIS — B351 Tinea unguium: Secondary | ICD-10-CM | POA: Diagnosis not present

## 2021-09-03 DIAGNOSIS — M542 Cervicalgia: Secondary | ICD-10-CM | POA: Diagnosis not present

## 2021-09-03 DIAGNOSIS — R519 Headache, unspecified: Secondary | ICD-10-CM | POA: Diagnosis not present

## 2021-09-03 DIAGNOSIS — I10 Essential (primary) hypertension: Secondary | ICD-10-CM | POA: Diagnosis not present

## 2021-09-18 DIAGNOSIS — L259 Unspecified contact dermatitis, unspecified cause: Secondary | ICD-10-CM | POA: Diagnosis not present

## 2021-09-18 DIAGNOSIS — W109XXA Fall (on) (from) unspecified stairs and steps, initial encounter: Secondary | ICD-10-CM | POA: Diagnosis not present

## 2021-09-18 DIAGNOSIS — R2689 Other abnormalities of gait and mobility: Secondary | ICD-10-CM | POA: Diagnosis not present

## 2021-09-18 DIAGNOSIS — S80812A Abrasion, left lower leg, initial encounter: Secondary | ICD-10-CM | POA: Diagnosis not present

## 2021-09-24 DIAGNOSIS — D225 Melanocytic nevi of trunk: Secondary | ICD-10-CM | POA: Diagnosis not present

## 2021-09-24 DIAGNOSIS — Z79899 Other long term (current) drug therapy: Secondary | ICD-10-CM | POA: Diagnosis not present

## 2021-09-24 DIAGNOSIS — D1801 Hemangioma of skin and subcutaneous tissue: Secondary | ICD-10-CM | POA: Diagnosis not present

## 2021-09-24 DIAGNOSIS — L814 Other melanin hyperpigmentation: Secondary | ICD-10-CM | POA: Diagnosis not present

## 2021-09-24 DIAGNOSIS — L400 Psoriasis vulgaris: Secondary | ICD-10-CM | POA: Diagnosis not present

## 2021-09-24 DIAGNOSIS — L237 Allergic contact dermatitis due to plants, except food: Secondary | ICD-10-CM | POA: Diagnosis not present

## 2021-09-24 DIAGNOSIS — L570 Actinic keratosis: Secondary | ICD-10-CM | POA: Diagnosis not present

## 2021-09-24 DIAGNOSIS — L821 Other seborrheic keratosis: Secondary | ICD-10-CM | POA: Diagnosis not present

## 2021-09-25 ENCOUNTER — Ambulatory Visit
Admission: RE | Admit: 2021-09-25 | Discharge: 2021-09-25 | Disposition: A | Discharge status: Home / Self Care | Payer: Medicare PPO | Source: Ambulatory Visit | Attending: Gastroenterology | Admitting: Gastroenterology

## 2021-09-25 ENCOUNTER — Other Ambulatory Visit: Payer: Self-pay | Admitting: Gastroenterology

## 2021-09-25 DIAGNOSIS — R42 Dizziness and giddiness: Secondary | ICD-10-CM | POA: Diagnosis not present

## 2021-09-25 DIAGNOSIS — K6389 Other specified diseases of intestine: Secondary | ICD-10-CM | POA: Diagnosis not present

## 2021-09-25 DIAGNOSIS — R140 Abdominal distension (gaseous): Secondary | ICD-10-CM | POA: Diagnosis not present

## 2021-09-25 DIAGNOSIS — R197 Diarrhea, unspecified: Secondary | ICD-10-CM

## 2021-09-25 DIAGNOSIS — K5900 Constipation, unspecified: Secondary | ICD-10-CM | POA: Diagnosis not present

## 2021-09-25 DIAGNOSIS — F419 Anxiety disorder, unspecified: Secondary | ICD-10-CM | POA: Diagnosis not present

## 2021-09-27 DIAGNOSIS — R42 Dizziness and giddiness: Secondary | ICD-10-CM | POA: Diagnosis not present

## 2021-09-27 DIAGNOSIS — R2689 Other abnormalities of gait and mobility: Secondary | ICD-10-CM | POA: Diagnosis not present

## 2021-10-03 DIAGNOSIS — R2689 Other abnormalities of gait and mobility: Secondary | ICD-10-CM | POA: Diagnosis not present

## 2021-10-04 DIAGNOSIS — F4323 Adjustment disorder with mixed anxiety and depressed mood: Secondary | ICD-10-CM | POA: Diagnosis not present

## 2021-10-09 DIAGNOSIS — R2689 Other abnormalities of gait and mobility: Secondary | ICD-10-CM | POA: Diagnosis not present

## 2021-10-11 DIAGNOSIS — R2689 Other abnormalities of gait and mobility: Secondary | ICD-10-CM | POA: Diagnosis not present

## 2021-10-17 DIAGNOSIS — R2689 Other abnormalities of gait and mobility: Secondary | ICD-10-CM | POA: Diagnosis not present

## 2021-10-19 DIAGNOSIS — R2689 Other abnormalities of gait and mobility: Secondary | ICD-10-CM | POA: Diagnosis not present

## 2021-10-23 DIAGNOSIS — R2689 Other abnormalities of gait and mobility: Secondary | ICD-10-CM | POA: Diagnosis not present

## 2021-10-25 DIAGNOSIS — R2689 Other abnormalities of gait and mobility: Secondary | ICD-10-CM | POA: Diagnosis not present

## 2021-10-29 DIAGNOSIS — R2689 Other abnormalities of gait and mobility: Secondary | ICD-10-CM | POA: Diagnosis not present

## 2021-11-14 DIAGNOSIS — K5900 Constipation, unspecified: Secondary | ICD-10-CM | POA: Diagnosis not present

## 2021-11-19 DIAGNOSIS — R2689 Other abnormalities of gait and mobility: Secondary | ICD-10-CM | POA: Diagnosis not present

## 2021-11-21 DIAGNOSIS — H16201 Unspecified keratoconjunctivitis, right eye: Secondary | ICD-10-CM | POA: Diagnosis not present

## 2021-11-23 DIAGNOSIS — R2689 Other abnormalities of gait and mobility: Secondary | ICD-10-CM | POA: Diagnosis not present

## 2021-11-26 DIAGNOSIS — R2689 Other abnormalities of gait and mobility: Secondary | ICD-10-CM | POA: Diagnosis not present

## 2021-11-28 DIAGNOSIS — R2689 Other abnormalities of gait and mobility: Secondary | ICD-10-CM | POA: Diagnosis not present

## 2021-12-04 DIAGNOSIS — R2689 Other abnormalities of gait and mobility: Secondary | ICD-10-CM | POA: Diagnosis not present

## 2021-12-06 DIAGNOSIS — R2689 Other abnormalities of gait and mobility: Secondary | ICD-10-CM | POA: Diagnosis not present

## 2021-12-06 DIAGNOSIS — F4323 Adjustment disorder with mixed anxiety and depressed mood: Secondary | ICD-10-CM | POA: Diagnosis not present

## 2021-12-18 DIAGNOSIS — R2689 Other abnormalities of gait and mobility: Secondary | ICD-10-CM | POA: Diagnosis not present

## 2021-12-25 DIAGNOSIS — R2689 Other abnormalities of gait and mobility: Secondary | ICD-10-CM | POA: Diagnosis not present

## 2021-12-31 DIAGNOSIS — R2689 Other abnormalities of gait and mobility: Secondary | ICD-10-CM | POA: Diagnosis not present

## 2021-12-31 DIAGNOSIS — K5900 Constipation, unspecified: Secondary | ICD-10-CM | POA: Diagnosis not present

## 2021-12-31 DIAGNOSIS — Z86010 Personal history of colonic polyps: Secondary | ICD-10-CM | POA: Diagnosis not present

## 2021-12-31 DIAGNOSIS — Z800 Family history of malignant neoplasm of digestive organs: Secondary | ICD-10-CM | POA: Diagnosis not present

## 2022-01-02 DIAGNOSIS — R2689 Other abnormalities of gait and mobility: Secondary | ICD-10-CM | POA: Diagnosis not present

## 2022-01-08 ENCOUNTER — Ambulatory Visit
Admission: RE | Admit: 2022-01-08 | Discharge: 2022-01-08 | Disposition: A | Discharge status: Home / Self Care | Payer: Medicare PPO | Source: Ambulatory Visit | Attending: Internal Medicine | Admitting: Internal Medicine

## 2022-01-08 ENCOUNTER — Other Ambulatory Visit: Payer: Self-pay | Admitting: Internal Medicine

## 2022-01-08 DIAGNOSIS — E7800 Pure hypercholesterolemia, unspecified: Secondary | ICD-10-CM | POA: Diagnosis not present

## 2022-01-08 DIAGNOSIS — Z23 Encounter for immunization: Secondary | ICD-10-CM | POA: Diagnosis not present

## 2022-01-08 DIAGNOSIS — K5909 Other constipation: Secondary | ICD-10-CM | POA: Diagnosis not present

## 2022-01-08 DIAGNOSIS — J45909 Unspecified asthma, uncomplicated: Secondary | ICD-10-CM | POA: Diagnosis not present

## 2022-01-08 DIAGNOSIS — R600 Localized edema: Secondary | ICD-10-CM

## 2022-01-08 DIAGNOSIS — I10 Essential (primary) hypertension: Secondary | ICD-10-CM | POA: Diagnosis not present

## 2022-01-08 DIAGNOSIS — F329 Major depressive disorder, single episode, unspecified: Secondary | ICD-10-CM | POA: Diagnosis not present

## 2022-01-08 DIAGNOSIS — K219 Gastro-esophageal reflux disease without esophagitis: Secondary | ICD-10-CM | POA: Diagnosis not present

## 2022-02-12 DIAGNOSIS — R2689 Other abnormalities of gait and mobility: Secondary | ICD-10-CM | POA: Diagnosis not present

## 2022-02-13 DIAGNOSIS — F4323 Adjustment disorder with mixed anxiety and depressed mood: Secondary | ICD-10-CM | POA: Diagnosis not present

## 2022-02-14 DIAGNOSIS — R2689 Other abnormalities of gait and mobility: Secondary | ICD-10-CM | POA: Diagnosis not present

## 2022-02-25 DIAGNOSIS — R2689 Other abnormalities of gait and mobility: Secondary | ICD-10-CM | POA: Diagnosis not present

## 2022-02-28 DIAGNOSIS — R2689 Other abnormalities of gait and mobility: Secondary | ICD-10-CM | POA: Diagnosis not present

## 2022-03-04 DIAGNOSIS — R2689 Other abnormalities of gait and mobility: Secondary | ICD-10-CM | POA: Diagnosis not present

## 2022-03-04 DIAGNOSIS — F4323 Adjustment disorder with mixed anxiety and depressed mood: Secondary | ICD-10-CM | POA: Diagnosis not present

## 2022-03-06 DIAGNOSIS — R2689 Other abnormalities of gait and mobility: Secondary | ICD-10-CM | POA: Diagnosis not present

## 2022-03-11 DIAGNOSIS — R2689 Other abnormalities of gait and mobility: Secondary | ICD-10-CM | POA: Diagnosis not present

## 2022-03-11 DIAGNOSIS — F3342 Major depressive disorder, recurrent, in full remission: Secondary | ICD-10-CM | POA: Diagnosis not present

## 2022-03-11 DIAGNOSIS — F411 Generalized anxiety disorder: Secondary | ICD-10-CM | POA: Diagnosis not present

## 2022-03-13 DIAGNOSIS — R2689 Other abnormalities of gait and mobility: Secondary | ICD-10-CM | POA: Diagnosis not present

## 2022-03-15 DIAGNOSIS — Z86010 Personal history of colonic polyps: Secondary | ICD-10-CM | POA: Diagnosis not present

## 2022-03-15 DIAGNOSIS — Z09 Encounter for follow-up examination after completed treatment for conditions other than malignant neoplasm: Secondary | ICD-10-CM | POA: Diagnosis not present

## 2022-03-15 DIAGNOSIS — D123 Benign neoplasm of transverse colon: Secondary | ICD-10-CM | POA: Diagnosis not present

## 2022-03-15 DIAGNOSIS — K649 Unspecified hemorrhoids: Secondary | ICD-10-CM | POA: Diagnosis not present

## 2022-03-19 DIAGNOSIS — D123 Benign neoplasm of transverse colon: Secondary | ICD-10-CM | POA: Diagnosis not present

## 2022-03-27 DIAGNOSIS — H2513 Age-related nuclear cataract, bilateral: Secondary | ICD-10-CM | POA: Diagnosis not present

## 2022-05-02 HISTORY — PX: OTHER SURGICAL HISTORY: SHX169

## 2022-06-13 ENCOUNTER — Encounter: Payer: Self-pay | Admitting: Nurse Practitioner

## 2022-06-19 ENCOUNTER — Ambulatory Visit: Payer: Medicare PPO | Admitting: Nurse Practitioner

## 2022-06-19 NOTE — Progress Notes (Deleted)
   Kim Ferrell 24-May-1945 [id number]   History:  77 y.o. G2P2002 presents for breast and pelvic exam. Hysterectomy many years go for fibroids. Using vaginal estrogen for atrophic vaginitis but does not use weekly. Normal pap and mammogram history. Osteopenia. Lichen sclerosis managed well with clobetasol cream.  Gynecologic History Patient's last menstrual period was 04/02/1987.   Contraception/Family planning: status post hysterectomy Sexually active: ***  Health Maintenance Last Pap: No longer screening per guidelines Last mammogram: 2022. Results were: Normal Last colonoscopy: 2018 Last Dexa: 07/18/2020. Results were: T-score -1.3, FRAX 13% / 3.2%  Past medical history, past surgical history, family history and social history were all reviewed and documented in the EPIC chart.  ROS:  A ROS was performed and pertinent positives and negatives are included.  Exam:  There were no vitals filed for this visit.  There is no height or weight on file to calculate BMI.  General appearance:  Normal Thyroid:  Symmetrical, normal in size, without palpable masses or nodularity. Respiratory  Auscultation:  Clear without wheezing or rhonchi Cardiovascular  Auscultation:  Regular rate, without rubs, murmurs or gallops  Edema/varicosities:  Not grossly evident Abdominal  Soft,nontender, without masses, guarding or rebound.  Liver/spleen:  No organomegaly noted  Hernia:  None appreciated  Skin  Inspection:  Grossly normal Breasts: Examined lying and sitting.   Right: Without masses, retractions, nipple discharge or axillary adenopathy.   Left: Without masses, retractions, nipple discharge or axillary adenopathy. Genitourinary   Inguinal/mons:  Normal without inguinal adenopathy  External genitalia:  Normal appearing vulva with no masses, tenderness, or lesions  BUS/Urethra/Skene's glands:  Normal  Vagina:  Normal appearing with normal color and discharge, no lesions  Cervix:  and  uterus absent  Adnexa/parametria:     Rt: Normal in size, without masses or tenderness.   Lt: Normal in size, without masses or tenderness.  Anus and perineum: Normal  Digital rectal exam: Deferred  Patient informed chaperone available to be present for breast and pelvic exam. Patient has requested no chaperone to be present. Patient has been advised what will be completed during breast and pelvic exam.   Assessment/Plan:  77 y.o. G2P2002 for breast and pelvic exam.   Well female exam with routine gynecological exam - Education provided on SBEs, importance of preventative screenings, current guidelines, high calcium diet, regular exercise, and multivitamin daily. Labs with PCP.   Osteopenia of multiple sites - Plan: DG Bone Density. 10/2017 T-score -1.8. Recommend daily vitamin D supplement and regular exercise and repeating DEXA.   Postmenopausal atrophic vaginitis - uses vaginal estrogen cream occasionally.   Lichen sclerosus - managed well with Clobetasol cream as needed. She currently has itching that started last night after getting in a hot tub.   Screening for cervical cancer - Normal Pap history.  No longer screening per guidelines.  Screening for breast cancer - Normal mammogram history.  Continue annual screenings.  Normal breast exam today.  Screening for colon cancer - 2018 colonoscopy. Will repeat at GI's recommended interval.   Return in 2 years for breast and pelvic exam.   Tamela Gammon DNP, 8:49 AM 06/19/2022

## 2022-07-04 DIAGNOSIS — F4323 Adjustment disorder with mixed anxiety and depressed mood: Secondary | ICD-10-CM | POA: Diagnosis not present

## 2022-08-01 DIAGNOSIS — F4323 Adjustment disorder with mixed anxiety and depressed mood: Secondary | ICD-10-CM | POA: Diagnosis not present

## 2022-08-05 ENCOUNTER — Encounter: Payer: Self-pay | Admitting: Nurse Practitioner

## 2022-08-05 ENCOUNTER — Ambulatory Visit (INDEPENDENT_AMBULATORY_CARE_PROVIDER_SITE_OTHER): Payer: Medicare PPO | Admitting: Nurse Practitioner

## 2022-08-05 VITALS — BP 120/74 | HR 78 | Ht 65.0 in | Wt 158.0 lb

## 2022-08-05 DIAGNOSIS — L900 Lichen sclerosus et atrophicus: Secondary | ICD-10-CM

## 2022-08-05 DIAGNOSIS — Z01419 Encounter for gynecological examination (general) (routine) without abnormal findings: Secondary | ICD-10-CM

## 2022-08-05 DIAGNOSIS — Z9189 Other specified personal risk factors, not elsewhere classified: Secondary | ICD-10-CM

## 2022-08-05 DIAGNOSIS — M8589 Other specified disorders of bone density and structure, multiple sites: Secondary | ICD-10-CM | POA: Diagnosis not present

## 2022-08-05 DIAGNOSIS — R42 Dizziness and giddiness: Secondary | ICD-10-CM | POA: Insufficient documentation

## 2022-08-05 DIAGNOSIS — N952 Postmenopausal atrophic vaginitis: Secondary | ICD-10-CM | POA: Diagnosis not present

## 2022-08-05 MED ORDER — ESTRADIOL 0.1 MG/GM VA CREA: 1.0000 g | TOPICAL_CREAM | VAGINAL | 1 refills | Status: DC

## 2022-08-05 MED ORDER — CLOBETASOL PROPIONATE 0.05 % EX CREA: 1.0000 | TOPICAL_CREAM | CUTANEOUS | 1 refills | Status: DC

## 2022-08-05 NOTE — Progress Notes (Signed)
   Siriyah Maclaren September 09, 1945 [id number]   History:  77 y.o. G2P2002 presents for breast and pelvic exam. Hysterectomy many years go for fibroids. Using vaginal estrogen cream for atrophic vaginitis, not consistently. Lichen sclerosis, using Clobetasol as needed, not consistent.  Normal pap and mammogram history. Osteopenia with elevated hip fracture risk.   Gynecologic History Patient's last menstrual period was 04/02/1987.   Contraception/Family planning: status post hysterectomy Sexually active: No  Health Maintenance Last Pap: No longer screening per guidelines Last mammogram: 2024. Results were: Normal per patient Last colonoscopy: 2018 Last Dexa: 07/18/2020. Results were: T-score -1.9, FRAX 13% / 3.2%  Past medical history, past surgical history, family history and social history were all reviewed and documented in the EPIC chart. Married. 2 children, 2 yo granddaughter. Very involved in her church, loves to travel.  ROS:  A ROS was performed and pertinent positives and negatives are included.  Exam:  Vitals:   08/05/22 1043  BP: 120/74  Pulse: 78  SpO2: 96%  Weight: 158 lb (71.7 kg)  Height: 5\' 5"  (1.651 m)    Body mass index is 26.29 kg/m.  General appearance:  Normal Thyroid:  Symmetrical, normal in size, without palpable masses or nodularity. Respiratory  Auscultation:  Clear without wheezing or rhonchi Cardiovascular  Auscultation:  Regular rate, without rubs, murmurs or gallops  Edema/varicosities:  Not grossly evident Abdominal  Soft,nontender, without masses, guarding or rebound.  Liver/spleen:  No organomegaly noted  Hernia:  None appreciated  Skin  Inspection:  Grossly normal   Breasts: Examined lying and sitting.   Right: Without masses, retractions, discharge or axillary adenopathy.   Left: Without masses, retractions, discharge or axillary adenopathy. Gentitourinary   Inguinal/mons:  Normal without inguinal adenopathy  External genitalia:  Thin,  pearly appearance from clitoral hood to perineum. Areas of excoriation present, 1 cm anterior fusion  BUS/Urethra/Skene's glands:  Normal  Vagina:  Atrophic changes  Cervix:  Absent  Uterus:  Absent  Adnexa/parametria:     Rt: Without masses or tenderness.   Lt: Without masses or tenderness.  Anus and perineum: Normal  Digital rectal exam: Deferred  Assessment/Plan:  77 y.o. [id number] for breast and pelvic exam.   Encounter for breast and pelvic examination - Education provided on SBEs, importance of preventative screenings, current guidelines, high calcium diet, regular exercise, and multivitamin daily. Labs with PCP.   Osteopenia of multiple sites - Plan: DG Bone Density. 06/2020 T-score -1.9 with elevated hip fracture risk of 3.2%. Reviewed most recent DXA report. Recommend daily vitamin D supplement and regular exercise.  Post-menopausal atrophic vaginitis - Plan: estradiol (ESTRACE VAGINAL) 0.1 MG/GM vaginal cream. Not using consistently.   Lichen sclerosus - Plan: clobetasol cream (TEMOVATE) 0.05 % twice weekly. Recommend consistent use.   Fracture Risk Assessment Score (FRAX) indicating greater than 3% risk for hip fracture - Plan: DG Bone Density. 3.2%  Screening for cervical cancer - Normal Pap history.  No longer screening per guidelines.  Screening for breast cancer - Normal mammogram history.  Continue annual screenings.  Normal breast exam today.  Screening for colon cancer - 2018 colonoscopy. Will repeat at GI's recommended interval.   Return in 1 year for medication management.      Olivia Mackie DNP, 11:00 AM 08/05/2022

## 2022-08-20 DIAGNOSIS — L03119 Cellulitis of unspecified part of limb: Secondary | ICD-10-CM | POA: Diagnosis not present

## 2022-08-27 ENCOUNTER — Other Ambulatory Visit: Payer: Self-pay | Admitting: Nurse Practitioner

## 2022-08-27 ENCOUNTER — Ambulatory Visit (INDEPENDENT_AMBULATORY_CARE_PROVIDER_SITE_OTHER): Payer: Medicare PPO

## 2022-08-27 DIAGNOSIS — Z01419 Encounter for gynecological examination (general) (routine) without abnormal findings: Secondary | ICD-10-CM

## 2022-08-27 DIAGNOSIS — N952 Postmenopausal atrophic vaginitis: Secondary | ICD-10-CM

## 2022-08-27 DIAGNOSIS — L900 Lichen sclerosus et atrophicus: Secondary | ICD-10-CM

## 2022-08-27 DIAGNOSIS — Z13820 Encounter for screening for osteoporosis: Secondary | ICD-10-CM

## 2022-08-27 DIAGNOSIS — M8589 Other specified disorders of bone density and structure, multiple sites: Secondary | ICD-10-CM

## 2022-08-27 DIAGNOSIS — Z780 Asymptomatic menopausal state: Secondary | ICD-10-CM | POA: Diagnosis not present

## 2022-08-27 DIAGNOSIS — Z9189 Other specified personal risk factors, not elsewhere classified: Secondary | ICD-10-CM

## 2022-08-29 DIAGNOSIS — F4323 Adjustment disorder with mixed anxiety and depressed mood: Secondary | ICD-10-CM | POA: Diagnosis not present

## 2022-09-23 DIAGNOSIS — F4323 Adjustment disorder with mixed anxiety and depressed mood: Secondary | ICD-10-CM | POA: Diagnosis not present

## 2022-10-24 DIAGNOSIS — F4323 Adjustment disorder with mixed anxiety and depressed mood: Secondary | ICD-10-CM | POA: Diagnosis not present

## 2022-11-25 DIAGNOSIS — L814 Other melanin hyperpigmentation: Secondary | ICD-10-CM | POA: Diagnosis not present

## 2022-11-25 DIAGNOSIS — C44629 Squamous cell carcinoma of skin of left upper limb, including shoulder: Secondary | ICD-10-CM | POA: Diagnosis not present

## 2022-11-25 DIAGNOSIS — L738 Other specified follicular disorders: Secondary | ICD-10-CM | POA: Diagnosis not present

## 2022-11-25 DIAGNOSIS — Z79899 Other long term (current) drug therapy: Secondary | ICD-10-CM | POA: Diagnosis not present

## 2022-11-25 DIAGNOSIS — L570 Actinic keratosis: Secondary | ICD-10-CM | POA: Diagnosis not present

## 2022-11-25 DIAGNOSIS — L820 Inflamed seborrheic keratosis: Secondary | ICD-10-CM | POA: Diagnosis not present

## 2022-11-25 DIAGNOSIS — D485 Neoplasm of uncertain behavior of skin: Secondary | ICD-10-CM | POA: Diagnosis not present

## 2022-11-25 DIAGNOSIS — L821 Other seborrheic keratosis: Secondary | ICD-10-CM | POA: Diagnosis not present

## 2022-11-25 DIAGNOSIS — L400 Psoriasis vulgaris: Secondary | ICD-10-CM | POA: Diagnosis not present

## 2022-11-25 DIAGNOSIS — D225 Melanocytic nevi of trunk: Secondary | ICD-10-CM | POA: Diagnosis not present

## 2022-11-26 DIAGNOSIS — I10 Essential (primary) hypertension: Secondary | ICD-10-CM | POA: Diagnosis not present

## 2022-11-26 DIAGNOSIS — K5909 Other constipation: Secondary | ICD-10-CM | POA: Diagnosis not present

## 2022-11-26 DIAGNOSIS — R490 Dysphonia: Secondary | ICD-10-CM | POA: Diagnosis not present

## 2022-11-26 DIAGNOSIS — E7800 Pure hypercholesterolemia, unspecified: Secondary | ICD-10-CM | POA: Diagnosis not present

## 2022-11-26 DIAGNOSIS — G43909 Migraine, unspecified, not intractable, without status migrainosus: Secondary | ICD-10-CM | POA: Diagnosis not present

## 2022-11-26 DIAGNOSIS — L4050 Arthropathic psoriasis, unspecified: Secondary | ICD-10-CM | POA: Diagnosis not present

## 2022-11-26 DIAGNOSIS — M189 Osteoarthritis of first carpometacarpal joint, unspecified: Secondary | ICD-10-CM | POA: Diagnosis not present

## 2022-11-26 DIAGNOSIS — Z0000 Encounter for general adult medical examination without abnormal findings: Secondary | ICD-10-CM | POA: Diagnosis not present

## 2022-11-26 DIAGNOSIS — J45909 Unspecified asthma, uncomplicated: Secondary | ICD-10-CM | POA: Diagnosis not present

## 2022-11-27 DIAGNOSIS — I10 Essential (primary) hypertension: Secondary | ICD-10-CM | POA: Diagnosis not present

## 2022-11-27 DIAGNOSIS — E7800 Pure hypercholesterolemia, unspecified: Secondary | ICD-10-CM | POA: Diagnosis not present

## 2022-12-16 DIAGNOSIS — R2689 Other abnormalities of gait and mobility: Secondary | ICD-10-CM | POA: Diagnosis not present

## 2022-12-23 DIAGNOSIS — L814 Other melanin hyperpigmentation: Secondary | ICD-10-CM | POA: Diagnosis not present

## 2022-12-23 DIAGNOSIS — C44629 Squamous cell carcinoma of skin of left upper limb, including shoulder: Secondary | ICD-10-CM | POA: Diagnosis not present

## 2022-12-23 DIAGNOSIS — L821 Other seborrheic keratosis: Secondary | ICD-10-CM | POA: Diagnosis not present

## 2022-12-30 DIAGNOSIS — Z79899 Other long term (current) drug therapy: Secondary | ICD-10-CM | POA: Diagnosis not present

## 2022-12-30 DIAGNOSIS — L400 Psoriasis vulgaris: Secondary | ICD-10-CM | POA: Diagnosis not present

## 2022-12-30 DIAGNOSIS — Z111 Encounter for screening for respiratory tuberculosis: Secondary | ICD-10-CM | POA: Diagnosis not present

## 2022-12-31 DIAGNOSIS — R2689 Other abnormalities of gait and mobility: Secondary | ICD-10-CM | POA: Diagnosis not present

## 2023-01-08 DIAGNOSIS — R2689 Other abnormalities of gait and mobility: Secondary | ICD-10-CM | POA: Diagnosis not present

## 2023-01-15 DIAGNOSIS — J189 Pneumonia, unspecified organism: Secondary | ICD-10-CM | POA: Diagnosis not present

## 2023-01-15 DIAGNOSIS — R051 Acute cough: Secondary | ICD-10-CM | POA: Diagnosis not present

## 2023-01-30 DIAGNOSIS — F3342 Major depressive disorder, recurrent, in full remission: Secondary | ICD-10-CM | POA: Diagnosis not present

## 2023-02-05 DIAGNOSIS — R2689 Other abnormalities of gait and mobility: Secondary | ICD-10-CM | POA: Diagnosis not present

## 2023-02-07 ENCOUNTER — Emergency Department (HOSPITAL_BASED_OUTPATIENT_CLINIC_OR_DEPARTMENT_OTHER): Payer: Medicare PPO

## 2023-02-07 ENCOUNTER — Emergency Department (HOSPITAL_BASED_OUTPATIENT_CLINIC_OR_DEPARTMENT_OTHER)
Admission: EM | Admit: 2023-02-07 | Discharge: 2023-02-07 | Disposition: A | Discharge status: Home / Self Care | Payer: Medicare PPO

## 2023-02-07 ENCOUNTER — Other Ambulatory Visit: Payer: Self-pay

## 2023-02-07 ENCOUNTER — Other Ambulatory Visit (HOSPITAL_BASED_OUTPATIENT_CLINIC_OR_DEPARTMENT_OTHER): Payer: Self-pay

## 2023-02-07 ENCOUNTER — Encounter (HOSPITAL_BASED_OUTPATIENT_CLINIC_OR_DEPARTMENT_OTHER): Payer: Self-pay

## 2023-02-07 DIAGNOSIS — N281 Cyst of kidney, acquired: Secondary | ICD-10-CM | POA: Diagnosis not present

## 2023-02-07 DIAGNOSIS — J45909 Unspecified asthma, uncomplicated: Secondary | ICD-10-CM | POA: Diagnosis not present

## 2023-02-07 DIAGNOSIS — K5900 Constipation, unspecified: Secondary | ICD-10-CM

## 2023-02-07 DIAGNOSIS — D72829 Elevated white blood cell count, unspecified: Secondary | ICD-10-CM | POA: Diagnosis not present

## 2023-02-07 DIAGNOSIS — K838 Other specified diseases of biliary tract: Secondary | ICD-10-CM | POA: Diagnosis not present

## 2023-02-07 DIAGNOSIS — I10 Essential (primary) hypertension: Secondary | ICD-10-CM | POA: Insufficient documentation

## 2023-02-07 DIAGNOSIS — R109 Unspecified abdominal pain: Secondary | ICD-10-CM | POA: Diagnosis not present

## 2023-02-07 LAB — CBC WITH DIFFERENTIAL/PLATELET
Abs Immature Granulocytes: 0.1 10*3/uL — ABNORMAL HIGH (ref 0.00–0.07)
Basophils Absolute: 0.1 10*3/uL (ref 0.0–0.1)
Basophils Relative: 1 %
Eosinophils Absolute: 0.1 10*3/uL (ref 0.0–0.5)
Eosinophils Relative: 1 %
HCT: 39.8 % (ref 36.0–46.0)
Hemoglobin: 13.5 g/dL (ref 12.0–15.0)
Immature Granulocytes: 1 %
Lymphocytes Relative: 6 %
Lymphs Abs: 1.1 10*3/uL (ref 0.7–4.0)
MCH: 30.3 pg (ref 26.0–34.0)
MCHC: 33.9 g/dL (ref 30.0–36.0)
MCV: 89.2 fL (ref 80.0–100.0)
Monocytes Absolute: 0.6 10*3/uL (ref 0.1–1.0)
Monocytes Relative: 3 %
Neutro Abs: 16.4 10*3/uL — ABNORMAL HIGH (ref 1.7–7.7)
Neutrophils Relative %: 88 %
Platelets: 571 10*3/uL — ABNORMAL HIGH (ref 150–400)
RBC: 4.46 MIL/uL (ref 3.87–5.11)
RDW: 15.3 % (ref 11.5–15.5)
WBC: 18.4 10*3/uL — ABNORMAL HIGH (ref 4.0–10.5)
nRBC: 0 % (ref 0.0–0.2)

## 2023-02-07 LAB — COMPREHENSIVE METABOLIC PANEL
ALT: 11 U/L (ref 0–44)
AST: 15 U/L (ref 15–41)
Albumin: 4.7 g/dL (ref 3.5–5.0)
Alkaline Phosphatase: 51 U/L (ref 38–126)
Anion gap: 8 (ref 5–15)
BUN: 16 mg/dL (ref 8–23)
CO2: 29 mmol/L (ref 22–32)
Calcium: 9.6 mg/dL (ref 8.9–10.3)
Chloride: 99 mmol/L (ref 98–111)
Creatinine, Ser: 1.06 mg/dL — ABNORMAL HIGH (ref 0.44–1.00)
GFR, Estimated: 54 mL/min — ABNORMAL LOW (ref >60)
Glucose, Bld: 106 mg/dL — ABNORMAL HIGH (ref 70–99)
Potassium: 3.3 mmol/L — ABNORMAL LOW (ref 3.5–5.1)
Sodium: 136 mmol/L (ref 135–145)
Total Bilirubin: 0.6 mg/dL (ref <1.2)
Total Protein: 7.2 g/dL (ref 6.5–8.1)

## 2023-02-07 MED ORDER — HYDROCORTISONE ACETATE 25 MG RE SUPP
25.0000 mg | Freq: Two times a day (BID) | RECTAL | 0 refills | Status: AC
  Filled 2023-02-07 – 2023-02-08 (×2): qty 24, 12d supply, fill #0

## 2023-02-07 MED ORDER — HYDROCORTISONE ACETATE 25 MG RE SUPP
25.0000 mg | Freq: Once | RECTAL | Status: AC
  Administered 2023-02-07: 25 mg via RECTAL
  Filled 2023-02-07: qty 1

## 2023-02-07 MED ORDER — IOHEXOL 300 MG/ML  SOLN
100.0000 mL | Freq: Once | INTRAMUSCULAR | Status: AC | PRN
  Administered 2023-02-07: 100 mL via INTRAVENOUS

## 2023-02-07 MED ORDER — FENTANYL CITRATE PF 50 MCG/ML IJ SOSY
50.0000 ug | PREFILLED_SYRINGE | Freq: Once | INTRAMUSCULAR | Status: AC
  Administered 2023-02-07: 50 ug via INTRAVENOUS
  Filled 2023-02-07: qty 1

## 2023-02-07 MED ORDER — SODIUM CHLORIDE 0.9 % IV BOLUS
500.0000 mL | Freq: Once | INTRAVENOUS | Status: AC
  Administered 2023-02-07: 500 mL via INTRAVENOUS

## 2023-02-07 MED ORDER — HYDROCORTISONE ACETATE 25 MG RE SUPP
25.0000 mg | Freq: Two times a day (BID) | RECTAL | 0 refills | Status: DC
  Filled 2023-02-07: qty 24, 12d supply, fill #0

## 2023-02-07 MED ORDER — ONDANSETRON HCL 4 MG/2ML IJ SOLN
4.0000 mg | Freq: Once | INTRAMUSCULAR | Status: AC
  Administered 2023-02-07: 4 mg via INTRAVENOUS
  Filled 2023-02-07: qty 2

## 2023-02-07 NOTE — ED Notes (Addendum)
RT Note:   Patient was having a decrease in her oxygen saturation of 80% on room air. Patient was placed on 3lpm [place] with an oxygen saturation increasing to 97%

## 2023-02-07 NOTE — ED Provider Notes (Signed)
Received handoff; disposition pending CT scan  Patient has had several bowel movements here in the emergency department.,  Had improvement after Lamisil. Physical Exam  BP 134/78   Pulse 87   Temp 98.2 F (36.8 C) (Oral)   Resp 16   LMP 04/02/1987   SpO2 95%   Physical Exam Vitals and nursing note reviewed. Exam conducted with a chaperone present.  HENT:     Head: Normocephalic.  Cardiovascular:     Rate and Rhythm: Normal rate and regular rhythm.  Pulmonary:     Effort: Pulmonary effort is normal.  Abdominal:     General: Abdomen is flat. There is no distension.     Palpations: Abdomen is soft.     Tenderness: There is no abdominal tenderness.  Genitourinary:    Adnexa: Right adnexa normal.     Rectum: Normal.     Comments: Digital rectal exam performed.  No significant stool in rectal vault. Skin:    General: Skin is warm and dry.  Neurological:     Mental Status: She is alert.     Procedures  Procedures  ED Course / MDM    Medical Decision Making Received signout from Dr. Jacqulyn Bath, see his note for full HPI.  Patient did desaturate after fentanyl and sleeping and was placed on nasal cannula.  While awake maintain oxygen saturation.  Contacted GI regarding CT findings, further recommendations no need to admit.  But would recommend attempt manual disimpaction, but as long as patient is having bowel movements can be discharged from their perspective.  GI suspects leukocytosis likely reactionary.  No need for antibiotics.  Recommending anusol MiraLAX, Linzess and home enemas.  Attempted disimpaction, but patient with no significant stool in rectal vault.  And requested I stop procedure secondary to her hemhroid pain. Will discharge with close GI follow up.   Amount and/or Complexity of Data Reviewed Labs: ordered. Radiology: ordered.  Risk Prescription drug management.          Coral Spikes, DO 02/07/23 2134

## 2023-02-07 NOTE — Discharge Instructions (Signed)
In addition to your constipation, CT scan found " . Abdominal fluid-filled structure measuring 18 mm with multiple  intraluminal densities, unclear if this represents a dilated  appendix or loop of small bowel. There is no adjacent inflammation  to suggest acute appendicitis. Recommend follow-up CT after  resolution of acute event with enteric contrast (on an elective  outpatient basis) for further assessment.  "  Please follow up with your primary doctor. Please return immediately if develop fevers, chills, worsening abdominal pain, nausea, vomiting, or any new or worsening symptoms that are concerning to you.  In terms of your constipation, please take MiraLAX 1 capful twice a day until you have soft bowel movements and then 1 capful daily thereafter.  Titrate to effect.  You must take this medication daily.  You also can continue with enemas and your Linzess.

## 2023-02-07 NOTE — ED Triage Notes (Signed)
Pt c/o severe constipation w possible impaction- "dripping diarrhea/ blood around it." First noticed at 10a, states that last "regular bowel movement was sometime last week- I took miralax last week & had a lot of cramping." Associated nausea, no vomiting.

## 2023-02-07 NOTE — ED Notes (Signed)
Patient able to have small amount of stool. Refuses enema at this time.

## 2023-02-07 NOTE — ED Provider Notes (Signed)
Emergency Department Provider Note   I have reviewed the triage vital signs and the nursing notes.   HISTORY  Chief Complaint Abdominal Pain and Constipation   HPI Jenniffer Mohammad is a 77 y.o. female with past history reviewed below presents the emergency department with constipation and rectal pain.  She notes she had a similar episode in January last year with good results from an enema.  She has had constipation over the past 7 to 10 days.  She had been treating at home with MiraLAX followed by Linzess today along with 2 fleets enemas.  She has had some blood-tinged diarrhea around this area but is feeling continued constipation.  Denies significant anterior abdominal pain.  No fevers.  Past Medical History:  Diagnosis Date   Anemia    hx   Arthritis    hands, knees   Asthma    has not needed any med. in 2 yrs.   Balance problem    Chronic bronchitis (HCC)    Dental crowns present    Depression    GERD (gastroesophageal reflux disease)    Hypertension    under control, has been on med. x 3-4 yrs.   Lichen sclerosus    Migraines    Osteopenia 10/2017   T score -1.8 FRAX 12% / 2.3% excluding history of lower leg fracture falling downstairs.   Pneumonia    hx   Psoriasis    Seasonal allergies    Trimalleolar fracture of right ankle 09/16/2011    Review of Systems  Constitutional: No fever/chills Cardiovascular: Denies chest pain. Respiratory: Denies shortness of breath. Gastrointestinal: Mild abdominal pain.  No nausea, no vomiting.  No diarrhea.  Positive constipation with rectal discomfort.  Genitourinary: Negative for dysuria. Musculoskeletal: Negative for back pain. Skin: Negative for rash. Neurological: Negative for headaches.  ____________________________________________   PHYSICAL EXAM:  VITAL SIGNS: ED Triage Vitals  Encounter Vitals Group     BP 02/07/23 1256 (!) 150/99     Pulse Rate 02/07/23 1256 75     Resp 02/07/23 1256 18     Temp 02/07/23  1256 97.9 F (36.6 C)     Temp src --      SpO2 02/07/23 1256 98 %   Constitutional: Alert and oriented. Well appearing and in no acute distress. Eyes: Conjunctivae are normal.  Head: Atraumatic. Nose: No congestion/rhinnorhea. Mouth/Throat: Mucous membranes are moist.   Neck: No stridor.   Cardiovascular: Normal rate, regular rhythm. Good peripheral circulation. Grossly normal heart sounds.   Respiratory: Normal respiratory effort.  No retractions. Lungs CTAB. Gastrointestinal: Soft and nontender. No distention.  Rectal exam performed with patient's verbal consent and nurse chaperone at bedside.  Patient with visible external, nonthrombosed hemorrhoids.  No hard stool in the vault.  No evidence of fecal impaction. No active bleeding.  Musculoskeletal: No lower extremity tenderness nor edema. No gross deformities of extremities. Neurologic:  Normal speech and language. No gross focal neurologic deficits are appreciated.  Skin:  Skin is warm, dry and intact. No rash noted.  ____________________________________________   LABS (all labs ordered are listed, but only abnormal results are displayed)  Labs Reviewed - No data to display ____________________________________________  EKG  *** ____________________________________________  RADIOLOGY  No results found.  ____________________________________________   PROCEDURES  Procedure(s) performed:   Procedures   ____________________________________________   INITIAL IMPRESSION / ASSESSMENT AND PLAN / ED COURSE  Pertinent labs & imaging results that were available during my care of the patient were reviewed by  me and considered in my medical decision making (see chart for details).   This patient is Presenting for Evaluation of rectal pain, which does require a range of treatment options, and is a complaint that involves a moderate risk of morbidity and mortality.  The Differential Diagnoses include fecal impaction,  hemorrhoids, perirectal abscess, SBO, colitis, etc.  Critical Interventions-    Medications - No data to display  Reassessment after intervention:     I did obtain Additional Historical Information from husband at bedside.   I decided to review pertinent External Data, and in summary patient with visit in January 2023; enema in the ED with relief of constipation at that time.   Clinical Laboratory Tests Ordered, included   Radiologic Tests Ordered, included ***. I independently interpreted the images and agree with radiology interpretation.   Cardiac Monitor Tracing which shows ***   Social Determinants of Health Risk patient is not an active smoker.   Consult complete with  Medical Decision Making: Summary:  Patient presents emergency department for evaluation of rectal pain and constipation.  Reevaluation with update and discussion with   ***Considered admission***  Patient's presentation is most consistent with acute presentation with potential threat to life or bodily function.   Disposition:   ____________________________________________  FINAL CLINICAL IMPRESSION(S) / ED DIAGNOSES  Final diagnoses:  None     NEW OUTPATIENT MEDICATIONS STARTED DURING THIS VISIT:  New Prescriptions   No medications on file    Note:  This document was prepared using Dragon voice recognition software and may include unintentional dictation errors.  Alona Bene, MD, [facility] Emergency [facility]

## 2023-02-07 NOTE — ED Notes (Signed)
Assisted patient with cleaning of stool. New pad and diaper placed.

## 2023-02-08 ENCOUNTER — Other Ambulatory Visit (HOSPITAL_BASED_OUTPATIENT_CLINIC_OR_DEPARTMENT_OTHER): Payer: Self-pay

## 2023-02-11 DIAGNOSIS — F4323 Adjustment disorder with mixed anxiety and depressed mood: Secondary | ICD-10-CM | POA: Diagnosis not present

## 2023-02-12 DIAGNOSIS — R2689 Other abnormalities of gait and mobility: Secondary | ICD-10-CM | POA: Diagnosis not present

## 2023-02-13 ENCOUNTER — Other Ambulatory Visit (HOSPITAL_BASED_OUTPATIENT_CLINIC_OR_DEPARTMENT_OTHER): Payer: Self-pay

## 2023-02-17 DIAGNOSIS — K5900 Constipation, unspecified: Secondary | ICD-10-CM | POA: Diagnosis not present

## 2023-02-17 DIAGNOSIS — D72823 Leukemoid reaction: Secondary | ICD-10-CM | POA: Diagnosis not present

## 2023-02-17 DIAGNOSIS — R935 Abnormal findings on diagnostic imaging of other abdominal regions, including retroperitoneum: Secondary | ICD-10-CM | POA: Diagnosis not present

## 2023-02-19 DIAGNOSIS — R2689 Other abnormalities of gait and mobility: Secondary | ICD-10-CM | POA: Diagnosis not present

## 2023-02-25 DIAGNOSIS — F4323 Adjustment disorder with mixed anxiety and depressed mood: Secondary | ICD-10-CM | POA: Diagnosis not present

## 2023-02-26 DIAGNOSIS — D473 Essential (hemorrhagic) thrombocythemia: Secondary | ICD-10-CM | POA: Diagnosis not present

## 2023-03-04 DIAGNOSIS — R2689 Other abnormalities of gait and mobility: Secondary | ICD-10-CM | POA: Diagnosis not present

## 2023-04-07 ENCOUNTER — Inpatient Hospital Stay: Payer: Medicare PPO | Admitting: Hematology and Oncology

## 2023-04-07 ENCOUNTER — Inpatient Hospital Stay: Payer: Medicare PPO | Attending: Hematology and Oncology

## 2023-04-07 ENCOUNTER — Other Ambulatory Visit: Payer: Self-pay | Admitting: *Deleted

## 2023-04-07 ENCOUNTER — Other Ambulatory Visit: Payer: Self-pay

## 2023-04-07 VITALS — BP 123/81 | HR 91 | Temp 98.2°F | Resp 16 | Wt 157.7 lb

## 2023-04-07 DIAGNOSIS — L409 Psoriasis, unspecified: Secondary | ICD-10-CM

## 2023-04-07 DIAGNOSIS — D473 Essential (hemorrhagic) thrombocythemia: Secondary | ICD-10-CM | POA: Insufficient documentation

## 2023-04-07 DIAGNOSIS — Z90710 Acquired absence of both cervix and uterus: Secondary | ICD-10-CM | POA: Diagnosis not present

## 2023-04-07 DIAGNOSIS — J45909 Unspecified asthma, uncomplicated: Secondary | ICD-10-CM | POA: Insufficient documentation

## 2023-04-07 DIAGNOSIS — Z87891 Personal history of nicotine dependence: Secondary | ICD-10-CM | POA: Insufficient documentation

## 2023-04-07 DIAGNOSIS — K219 Gastro-esophageal reflux disease without esophagitis: Secondary | ICD-10-CM | POA: Diagnosis not present

## 2023-04-07 DIAGNOSIS — Z79899 Other long term (current) drug therapy: Secondary | ICD-10-CM

## 2023-04-07 DIAGNOSIS — Z823 Family history of stroke: Secondary | ICD-10-CM | POA: Insufficient documentation

## 2023-04-07 DIAGNOSIS — Z885 Allergy status to narcotic agent status: Secondary | ICD-10-CM | POA: Insufficient documentation

## 2023-04-07 DIAGNOSIS — Z800 Family history of malignant neoplasm of digestive organs: Secondary | ICD-10-CM

## 2023-04-07 DIAGNOSIS — I10 Essential (primary) hypertension: Secondary | ICD-10-CM | POA: Diagnosis not present

## 2023-04-07 DIAGNOSIS — G43909 Migraine, unspecified, not intractable, without status migrainosus: Secondary | ICD-10-CM | POA: Insufficient documentation

## 2023-04-07 DIAGNOSIS — Z8042 Family history of malignant neoplasm of prostate: Secondary | ICD-10-CM | POA: Diagnosis not present

## 2023-04-07 DIAGNOSIS — D75839 Thrombocytosis, unspecified: Secondary | ICD-10-CM

## 2023-04-07 DIAGNOSIS — Z808 Family history of malignant neoplasm of other organs or systems: Secondary | ICD-10-CM | POA: Diagnosis not present

## 2023-04-07 DIAGNOSIS — Z8249 Family history of ischemic heart disease and other diseases of the circulatory system: Secondary | ICD-10-CM

## 2023-04-07 DIAGNOSIS — D75838 Other thrombocytosis: Secondary | ICD-10-CM

## 2023-04-07 DIAGNOSIS — J4489 Other specified chronic obstructive pulmonary disease: Secondary | ICD-10-CM | POA: Diagnosis not present

## 2023-04-07 DIAGNOSIS — Z833 Family history of diabetes mellitus: Secondary | ICD-10-CM | POA: Diagnosis not present

## 2023-04-07 LAB — CBC WITH DIFFERENTIAL (CANCER CENTER ONLY)
Abs Immature Granulocytes: 0.05 10*3/uL (ref 0.00–0.07)
Basophils Absolute: 0.1 10*3/uL (ref 0.0–0.1)
Basophils Relative: 1 %
Eosinophils Absolute: 0.3 10*3/uL (ref 0.0–0.5)
Eosinophils Relative: 3 %
HCT: 38.1 % (ref 36.0–46.0)
Hemoglobin: 13.1 g/dL (ref 12.0–15.0)
Immature Granulocytes: 1 %
Lymphocytes Relative: 21 %
Lymphs Abs: 1.6 10*3/uL (ref 0.7–4.0)
MCH: 30.3 pg (ref 26.0–34.0)
MCHC: 34.4 g/dL (ref 30.0–36.0)
MCV: 88.2 fL (ref 80.0–100.0)
Monocytes Absolute: 0.6 10*3/uL (ref 0.1–1.0)
Monocytes Relative: 7 %
Neutro Abs: 5.4 10*3/uL (ref 1.7–7.7)
Neutrophils Relative %: 67 %
Platelet Count: 433 10*3/uL — ABNORMAL HIGH (ref 150–400)
RBC: 4.32 MIL/uL (ref 3.87–5.11)
RDW: 14.8 % (ref 11.5–15.5)
WBC Count: 8 10*3/uL (ref 4.0–10.5)
nRBC: 0 % (ref 0.0–0.2)

## 2023-04-07 LAB — CMP (CANCER CENTER ONLY)
ALT: 12 U/L (ref 0–44)
AST: 15 U/L (ref 15–41)
Albumin: 4.3 g/dL (ref 3.5–5.0)
Alkaline Phosphatase: 58 U/L (ref 38–126)
Anion gap: 7 (ref 5–15)
BUN: 24 mg/dL — ABNORMAL HIGH (ref 8–23)
CO2: 30 mmol/L (ref 22–32)
Calcium: 9.3 mg/dL (ref 8.9–10.3)
Chloride: 98 mmol/L (ref 98–111)
Creatinine: 1.04 mg/dL — ABNORMAL HIGH (ref 0.44–1.00)
GFR, Estimated: 55 mL/min — ABNORMAL LOW (ref >60)
Glucose, Bld: 96 mg/dL (ref 70–99)
Potassium: 3.4 mmol/L — ABNORMAL LOW (ref 3.5–5.1)
Sodium: 135 mmol/L (ref 135–145)
Total Bilirubin: 0.6 mg/dL (ref 0.0–1.2)
Total Protein: 6.8 g/dL (ref 6.5–8.1)

## 2023-04-07 LAB — IRON AND IRON BINDING CAPACITY (CC-WL,HP ONLY)
Iron: 149 ug/dL (ref 28–170)
Saturation Ratios: 52 % — ABNORMAL HIGH (ref 10.4–31.8)
TIBC: 288 ug/dL (ref 250–450)
UIBC: 139 ug/dL — ABNORMAL LOW (ref 148–442)

## 2023-04-07 LAB — C-REACTIVE PROTEIN: CRP: <0.5 mg/dL (ref <1.0)

## 2023-04-07 LAB — FERRITIN: Ferritin: 140 ng/mL (ref 11–307)

## 2023-04-07 LAB — SEDIMENTATION RATE: Sed Rate: 5 mm/h (ref 0–22)

## 2023-04-07 NOTE — Progress Notes (Signed)
 [facility] [phone]   [phone]  INITIAL CONSULT NOTE  Patient Care Team: [name], MD as PCP - General (Internal Medicine) [name], NP as Nurse Practitioner (Gynecology)  Hematological/Oncological History # Elevated Platelets  02/07/2023: White blood cell 18.4, hemoglobin 13.5, MCV 89.2, platelets 571 04/07/2023: Establish care with Dr. Federico  CHIEF COMPLAINTS/PURPOSE OF CONSULTATION:   Elevated Platelets    HISTORY OF PRESENTING ILLNESS:  Kim Ferrell 78 y.o. female with medical history significant for asthma, GERD, hypertension, migraines, psoriasis, and seasonal allergies who presents for evaluation of thrombocytosis.  On review of the previous records the patient last had labs collected on 02/07/2023 which showed White blood cell 18.4, hemoglobin 13.5, MCV 89.2, platelets 571.  Due to concern for these findings the patient was referred to hematology for further evaluation and management.  On exam today Kim Ferrell reports that she is here today because her primary care provider told her she had elevated platelets.  She reports that she has a history of psoriasis which she has been living with for 30 years.  She reports has been no recent changes and she has been on Humira for many years.  She is not currently on any steroid therapy.  She reports it is typically under control and there are some areas of flare that she puts a cream on.  She reports that predominately occurs on her arms and legs.  She reports that she has had iron deficiency before in the past and has been on iron pills but it has been years.  She notes that she has not been having any issues with bleeding, bruising, or dark stools.  She reports that she has been having occasional nosebleeds in the last 2 to 3 months.  She has had no recent infections though she did have the flu over Christmas.  She reports that she does feel a little tired and fatigued since that  infection.  On further discussion she reports her paternal grandmother and mother both had pernicious anemia.  She had a uncle on her mother side with leukemia and a sister with multiple myeloma.  She has 2 healthy children.  She reports her father passed away of an aortic aneurysm.  She is a former smoker having quit 47 years ago.  She drinks about 2 to 3 glasses of wine per night.  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  She otherwise denies any fevers, chills, sweats, nausea, vomiting or diarrhea.  A full 10 point ROS is otherwise negative.  MEDICAL HISTORY:  Past Medical History:  Diagnosis Date   Anemia    hx   Arthritis    hands, knees   Asthma    has not needed any med. in 2 yrs.   Balance problem    Chronic bronchitis (HCC)    Dental crowns present    Depression    GERD (gastroesophageal reflux disease)    Hypertension    under control, has been on med. x 3-4 yrs.   Lichen sclerosus    Migraines    Osteopenia 10/2017   T score -1.8 FRAX 12% / 2.3% excluding history of lower leg fracture falling downstairs.   Pneumonia    hx   Psoriasis    Seasonal allergies    Trimalleolar fracture of right ankle 09/16/2011    SURGICAL HISTORY: Past Surgical History:  Procedure Laterality Date   ABDOMINAL HYSTERECTOMY     partial   ANTERIOR CERVICAL DECOMP/DISCECTOMY  FUSION N/A 11/02/2012   Procedure: ANTERIOR CERVICAL DECOMPRESSION/DISCECTOMY FUSION  cervical five-six cervical six-seven;  Surgeon: Victory Gens, MD;  Location: [facility];  Service: Neurosurgery;  Laterality: N/A;  Cervical five six  Cervical six-seven Anterior cervical decompression/diskectomy/fusion   cataract surgery Bilateral 05/2022   The other 05/2022   HAND SURGERY     rt strep infection   KNEE SURGERY     ORIF ANKLE FRACTURE  09/17/2011   Procedure: OPEN REDUCTION INTERNAL FIXATION (ORIF) ANKLE FRACTURE;  Surgeon: Eva Elsie Herring, MD;  Location: [facility];  Service:  Orthopedics;  Laterality: Right;   TONSILLECTOMY      SOCIAL HISTORY: Social History   Socioeconomic History   Marital status: Married    Spouse name: Not on file   Number of children: Not on file   Years of education: Not on file   Highest education level: Not on file  Occupational History   Not on file  Tobacco Use   Smoking status: Former    Current packs/day: 0.00    Average packs/day: 1 pack/day for 5.0 years (5.0 ttl pk-yrs)    Types: Cigarettes    Start date: 10/27/1969    Quit date: 10/28/1974    Years since quitting: 48.4   Smokeless tobacco: Never   Tobacco comments:    quit smoking 36 yrs. ago  Vaping Use   Vaping status: Never Used  Substance and Sexual Activity   Alcohol use: Yes    Comment:  3 glasses of wine daily   Drug use: No   Sexual activity: Not Currently    Birth control/protection: Surgical    Comment: Hyst; First IC >16 y/o, <5 Partners  Other Topics Concern   Not on file  Social History Narrative   Not on file   Social Drivers of Health   Financial Resource Strain: Not on file  Food Insecurity: Not on file  Transportation Needs: Not on file  Physical Activity: Not on file  Stress: Not on file  Social Connections: Not on file  Intimate Partner Violence: Not on file    FAMILY HISTORY: Family History  Problem Relation Age of Onset   Diabetes Mother    Heart disease Mother    Cancer Father        skin   Heart disease Father    Stroke Sister    Heart disease Brother    Cancer Brother        Colon,Liver,Prostate    ALLERGIES:  is allergic to codeine, adhesive [tape], gold-containing drug products, neomycin-bacitracin  zn-polymyx, nickel, and silver.  MEDICATIONS:  Current Outpatient Medications  Medication Sig Dispense Refill   Adalimumab (HUMIRA [place]) Inject into the skin.     albuterol  (PROVENTIL  HFA;VENTOLIN  HFA) 108 (90 BASE) MCG/ACT inhaler Inhale 2 puffs into the lungs every 6 (six) hours as needed for shortness of breath.      ALPRAZolam (XANAX) 0.25 MG tablet 1 tablet (Patient not taking: Reported on 08/05/2022)     buPROPion  (WELLBUTRIN  SR) 100 MG 12 hr tablet Take 100 mg by mouth daily.     cetirizine (ZYRTEC) 10 MG tablet Take 10 mg by mouth daily.     clobetasol  cream (TEMOVATE ) 0.05 % Apply 1 Application topically 2 (two) times a week. 60 g 1   estradiol  (ESTRACE  VAGINAL) 0.1 MG/GM vaginal cream Place 1 g vaginally 2 (two) times a week. 42.5 g 1   fluticasone  (FLONASE ) 50 MCG/ACT nasal spray Place 2 sprays into the nose daily  as needed for rhinitis.     halobetasol (ULTRAVATE) 0.05 % cream Apply 1 application topically daily.      hydrocortisone  (ANUSOL -HC) 2.5 % rectal cream Place 1 application rectally 2 (two) times daily. 30 g 0   LINZESS 290 MCG CAPS capsule Take 290 mcg by mouth daily.     losartan -hydrochlorothiazide  (HYZAAR) 100-25 MG per tablet Take 1 tablet by mouth daily.     NONFORMULARY OR COMPOUNDED ITEM Estradiol  vaginal cream 0.02% Vaginal Cream S: Insert 1 gram intravaginally twice weekly. 90 each 2   omeprazole (PRILOSEC) 20 MG capsule Take 20 mg by mouth 2 (two) times daily.     sertraline (ZOLOFT) 100 MG tablet Take 150 mg by mouth daily.     SUMAtriptan  (IMITREX ) 100 MG tablet Take 100 mg by mouth every 2 (two) hours as needed for migraine. For headache     No current facility-administered medications for this visit.    REVIEW OF SYSTEMS:   Constitutional: ( - ) fevers, ( - )  chills , ( - ) night sweats Eyes: ( - ) blurriness of vision, ( - ) double vision, ( - ) watery eyes Ears, nose, mouth, throat, and face: ( - ) mucositis, ( - ) sore throat Respiratory: ( - ) cough, ( - ) dyspnea, ( - ) wheezes Cardiovascular: ( - ) palpitation, ( - ) chest discomfort, ( - ) lower extremity swelling Gastrointestinal:  ( - ) nausea, ( - ) heartburn, ( - ) change in bowel habits Skin: ( - ) abnormal skin rashes Lymphatics: ( - ) new lymphadenopathy, ( - ) easy bruising Neurological: ( - )  numbness, ( - ) tingling, ( - ) new weaknesses Behavioral/Psych: ( - ) mood change, ( - ) new changes  All other systems were reviewed with the patient and are negative.  PHYSICAL EXAMINATION:  Vitals:   04/07/23 1351  BP: 123/81  Pulse: 91  Resp: 16  Temp: 98.2 F (36.8 C)  SpO2: 97%   Filed Weights   04/07/23 1351  Weight: 157 lb 11.2 oz (71.5 kg)    GENERAL: well appearing elderly Caucasian female in NAD  SKIN: skin color, texture, turgor are normal, no rashes or significant lesions EYES: conjunctiva are pink and non-injected, sclera clear LUNGS: clear to auscultation and percussion with normal breathing effort HEART: regular rate & rhythm and no murmurs and no lower extremity edema Musculoskeletal: no cyanosis of digits and no clubbing  PSYCH: alert & oriented x 3, fluent speech NEURO: no focal motor/sensory deficits  LABORATORY DATA:  I have reviewed the data as listed    Latest Ref Rng & Units 04/07/2023    2:28 PM 02/07/2023    2:08 PM 10/27/2012    9:46 AM  CBC  WBC 4.0 - 10.5 K/uL 8.0  18.4  6.3   Hemoglobin 12.0 - 15.0 g/dL 86.8  86.4  86.7   Hematocrit 36.0 - 46.0 % 38.1  39.8  39.0   Platelets 150 - 400 K/uL 433  571  257        Latest Ref Rng & Units 04/07/2023    2:28 PM 02/07/2023    2:08 PM 10/27/2012    9:46 AM  CMP  Glucose 70 - 99 mg/dL 96  893  94   BUN 8 - 23 mg/dL 24  16  17    Creatinine 0.44 - 1.00 mg/dL 8.95  8.93  9.08   Sodium 135 - 145 mmol/L 135  136  137   Potassium 3.5 - 5.1 mmol/L 3.4  3.3  3.7   Chloride 98 - 111 mmol/L 98  99  98   CO2 22 - 32 mmol/L 30  29  31    Calcium 8.9 - 10.3 mg/dL 9.3  9.6  9.5   Total Protein 6.5 - 8.1 g/dL 6.8  7.2    Total Bilirubin 0.0 - 1.2 mg/dL 0.6  0.6    Alkaline Phos 38 - 126 U/L 58  51    AST 15 - 41 U/L 15  15    ALT 0 - 44 U/L 12  11       ASSESSMENT & PLAN Kim Ferrell 78 y.o. female with medical history significant for asthma, GERD, hypertension, migraines, psoriasis, and seasonal allergies  who presents for evaluation of thrombocytosis.  After review of the labs, review of the records, and discussion with the patient the patients findings are most consistent with elevated platelets of unclear etiology.  There are two types of thrombocytosis, essential thrombocytosis and secondary thrombocytosis. Essential thrombocytosis is overproduction of platelets due to a driver mutation. The most common mutation is the JAK2 [id number] (50% of cases), but other causes include CALR, MPL, and mutations of unclear significance on NGS. Essential thrombocytosis is a myeloproliferative neoplasm which may require cytoreductive therapy do decrease risk of thrombosis. Secondary thrombocytosis can be caused by low iron levels, increased inflammation,  or splenectomy.  Our workup will focus on determining if there is a secondary cause of this patient's thrombocytosis.    # Thrombocytosis, likely 2/2 to Inflammation  --workup to include CBC, CMP, ESR/CRP, and iron panel/ferritin  --patient has no history of splenectomy     --will order MPN workup to include JAK2 with reflex and BCR/ABL FISH  --no indication for MPN workup at this time  --RTC in 3 months or sooner if indicated by the above labs.     Orders Placed This Encounter  Procedures   CBC with Differential (Cancer Center Only)    Standing Status:   Future    Number of Occurrences:   1    Expiration Date:   04/06/2024   CMP (Cancer Center only)    Standing Status:   Future    Number of Occurrences:   1    Expiration Date:   04/06/2024   Ferritin    Standing Status:   Future    Number of Occurrences:   1    Expiration Date:   04/06/2024   Iron and Iron Binding Capacity ([facility] only)    Standing Status:   Future    Number of Occurrences:   1    Expiration Date:   04/06/2024   Sedimentation rate    Standing Status:   Future    Number of Occurrences:   1    Expiration Date:   04/06/2024   C-reactive protein    Standing Status:   Future    Number of  Occurrences:   1    Expiration Date:   04/06/2024   BCR-ABL1 FISH    Standing Status:   Future    Number of Occurrences:   1    Expiration Date:   04/06/2024   Miscellaneous [facility] test (send-out)    Standing Status:   Future    Number of Occurrences:   1    Expected Date:   04/07/2023    Expiration Date:   04/06/2024    Test name / description::   JAK2, CALR, MPL  with reflex    All questions were answered. The patient knows to call the clinic with any problems, questions or concerns.  A total of more than 60 minutes were spent on this encounter with face-to-face time and non-face-to-face time, including preparing to see the patient, ordering tests and/or medications, counseling the patient and coordination of care as outlined above.   Norleen IVAR Kidney, MD Department of Hematology/Oncology [facility] Phone: [phone] Pager: [phone] Email: [email or web address]  04/07/2023 4:24 PM

## 2023-04-11 LAB — BCR-ABL1 FISH
Cells Analyzed: 200
Cells Counted: 200

## 2023-04-13 LAB — MISC LABCORP TEST (SEND OUT): Labcorp test code: 489514

## 2023-04-14 ENCOUNTER — Telehealth: Payer: Self-pay | Admitting: *Deleted

## 2023-04-14 NOTE — Telephone Encounter (Signed)
 TCT patient to advise that her labs did show a JAK2 mutation. 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 We will plan to see her back once the bone marrow biopsy is complete to discuss further. Please let me know if she is agreeable to being scheduled for a bone marrow biopsy.   No answer but was able to leave vm message for pt to return this call to [phone]

## 2023-04-17 ENCOUNTER — Other Ambulatory Visit: Payer: Self-pay | Admitting: Hematology and Oncology

## 2023-04-17 DIAGNOSIS — D75839 Thrombocytosis, unspecified: Secondary | ICD-10-CM

## 2023-04-17 NOTE — Telephone Encounter (Signed)
Received call back from patient regarding recent lab results. Explained the need for the bone marrow biopsy related to confirming diagnosis of ET. Educated on the procedure itself. Pt agreeable to go forward with the biopsy. Dr. Leonides Schanz made aware.

## 2023-04-23 ENCOUNTER — Telehealth: Payer: Self-pay | Admitting: Physician Assistant

## 2023-04-23 NOTE — Telephone Encounter (Signed)
Scheduled appointments per scheduling message. Patient is aware of the made appointments and will be mailed an appointment reminder.

## 2023-04-24 ENCOUNTER — Other Ambulatory Visit: Payer: Self-pay | Admitting: Physician Assistant

## 2023-04-24 DIAGNOSIS — Z01818 Encounter for other preprocedural examination: Secondary | ICD-10-CM

## 2023-04-24 NOTE — H&P (Signed)
Chief Complaint: Patient was seen in consultation today for thrombocytosis  Referring Physician(s): Dorsey,John T IV  Supervising Physician: Richarda Overlie  Patient Status: [facility] - Out-pt  History of Present Illness: Kim Ferrell is a 78 y.o. female with past medical history of anemia, GERD, osteopenia with new thrombocytosis followed by Dr. Leonides Schanz.  IR consulted for bone marrow biopsy.  Case reviewed and approved by Dr. Fredia Sorrow.   Patient presents to [facility] today in their usual state of health.  She has been NPO.  She does not take blood thinners.  Her family is available for care and transportation post-procedure.   Past Medical History:  Diagnosis Date   Anemia    hx   Arthritis    hands, knees   Asthma    has not needed any med. in 2 yrs.   Balance problem    Chronic bronchitis (HCC)    Dental crowns present    Depression    GERD (gastroesophageal reflux disease)    Hypertension    under control, has been on med. x 3-4 yrs.   Lichen sclerosus    Migraines    Osteopenia 10/2017   T score -1.8 FRAX 12% / 2.3% excluding history of lower leg fracture falling downstairs.   Pneumonia    hx   Psoriasis    Seasonal allergies    Trimalleolar fracture of right ankle 09/16/2011    Past Surgical History:  Procedure Laterality Date   ABDOMINAL HYSTERECTOMY     partial   ANTERIOR CERVICAL DECOMP/DISCECTOMY FUSION N/A 11/02/2012   Procedure: ANTERIOR CERVICAL DECOMPRESSION/DISCECTOMY FUSION  cervical five-six cervical six-seven;  Surgeon: Barnett Abu, MD;  Location: [facility];  Service: Neurosurgery;  Laterality: N/A;  Cervical five six  Cervical six-seven Anterior cervical decompression/diskectomy/fusion   cataract surgery Bilateral 05/2022   The other 05/2022   HAND SURGERY     rt strep infection   KNEE SURGERY     ORIF ANKLE FRACTURE  09/17/2011   Procedure: OPEN REDUCTION INTERNAL FIXATION (ORIF) ANKLE FRACTURE;  Surgeon: Mable Paris, MD;  Location:  [facility];  Service: Orthopedics;  Laterality: Right;   TONSILLECTOMY      Allergies: Codeine, Adhesive [tape], Gold-containing drug products, Neomycin-bacitracin zn-polymyx, Nickel, and Silver  Medications: Prior to Admission medications   Medication Sig Start Date End Date Taking? Authorizing Provider  Adalimumab (HUMIRA ) Inject into the skin.    [provider]  albuterol (PROVENTIL HFA;VENTOLIN HFA) 108 (90 BASE) MCG/ACT inhaler Inhale 2 puffs into the lungs every 6 (six) hours as needed for shortness of breath.    [provider]  ALPRAZolam Prudy Feeler) 0.25 MG tablet 1 tablet Patient not taking: Reported on 08/05/2022 12/10/19   [provider]  buPROPion (WELLBUTRIN SR) 100 MG 12 hr tablet Take 100 mg by mouth daily.    [provider]  cetirizine (ZYRTEC) 10 MG tablet Take 10 mg by mouth daily.    [provider]  clobetasol cream (TEMOVATE) 0.05 % Apply 1 Application topically 2 (two) times a week. 08/05/22   Olivia Mackie, NP  estradiol (ESTRACE VAGINAL) 0.1 MG/GM vaginal cream Place 1 g vaginally 2 (two) times a week. 08/05/22   Olivia Mackie, NP  fluticasone (FLONASE) 50 MCG/ACT nasal spray Place 2 sprays into the nose daily as needed for rhinitis.    [provider]  halobetasol (ULTRAVATE) 0.05 % cream Apply 1 application topically daily.     [provider]  hydrocortisone (ANUSOL-HC) 2.5 % rectal cream Place 1 application rectally 2 (two) times daily. 04/14/21   Terrilee Files, MD  LINZESS 290 MCG CAPS capsule Take 290 mcg by mouth daily.    [provider]  losartan-hydrochlorothiazide (HYZAAR) 100-25 MG per tablet Take 1 tablet by mouth daily.    [provider]  NONFORMULARY OR COMPOUNDED ITEM Estradiol vaginal cream 0.02% Vaginal Cream S: Insert 1 gram intravaginally twice weekly. 11/16/20   Olivia Mackie, NP  omeprazole (PRILOSEC) 20 MG capsule Take 20 mg by mouth 2  (two) times daily.    [provider]  sertraline (ZOLOFT) 100 MG tablet Take 150 mg by mouth daily. 07/24/21   [provider]  SUMAtriptan (IMITREX) 100 MG tablet Take 100 mg by mouth every 2 (two) hours as needed for migraine. For headache    [provider]     Family History  Problem Relation Age of Onset   Diabetes Mother    Heart disease Mother    Cancer Father        skin   Heart disease Father    Stroke Sister    Heart disease Brother    Cancer Brother        Colon,Liver,Prostate    Social History   Socioeconomic History   Marital status: Married    Spouse name: Not on file   Number of children: Not on file   Years of education: Not on file   Highest education level: Not on file  Occupational History   Not on file  Tobacco Use   Smoking status: Former    Current packs/day: 0.00    Average packs/day: 1 pack/day for 5.0 years (5.0 ttl pk-yrs)    Types: Cigarettes    Start date: 10/27/1969    Quit date: 10/28/1974    Years since quitting: 48.5   Smokeless tobacco: Never   Tobacco comments:    quit smoking 36 yrs. ago  Vaping Use   Vaping status: Never Used  Substance and Sexual Activity   Alcohol use: Yes    Comment:  3 glasses of wine daily   Drug use: No   Sexual activity: Not Currently    Birth control/protection: Surgical    Comment: Hyst; First IC >16 y/o, <5 Partners  Other Topics Concern   Not on file  Social History Narrative   Not on file   Social Drivers of Health   Financial Resource Strain: Not on file  Food Insecurity: Not on file  Transportation Needs: Not on file  Physical Activity: Not on file  Stress: Not on file  Social Connections: Not on file     Review of Systems: A 12 point ROS discussed and pertinent positives are indicated in the HPI above.  All other systems are negative.  Review of Systems  Constitutional:  Negative for fatigue and fever.  Respiratory:  Negative for cough and shortness of  breath.   Cardiovascular:  Negative for chest pain.  Gastrointestinal:  Negative for abdominal pain.  Musculoskeletal:  Negative for back pain.  Skin:  Negative for pallor.  Psychiatric/Behavioral:  Negative for behavioral problems and confusion.     Vital Signs: BP (!) 158/85 (BP Location: Right Arm)   Pulse 75   Temp 97.7 F (36.5 C) (Oral)   Resp 16   Ht 5\' 5"  (1.651 m)   Wt 156 lb (70.8 kg)   LMP 04/02/1987   SpO2 96%   BMI 25.96 kg/m   Physical  Exam Vitals and nursing note reviewed.  Constitutional:      General: She is not in acute distress.    Appearance: Normal appearance. She is not ill-appearing.  HENT:     Mouth/Throat:     Mouth: Mucous membranes are moist.     Pharynx: Oropharynx is clear.  Cardiovascular:     Rate and Rhythm: Normal rate and regular rhythm.  Pulmonary:     Effort: Pulmonary effort is normal. No respiratory distress.     Breath sounds: Normal breath sounds.  Abdominal:     General: Abdomen is flat.     Palpations: Abdomen is soft.  Skin:    General: Skin is warm and dry.  Neurological:     General: No focal deficit present.     Mental Status: She is alert and oriented to person, place, and time. Mental status is at baseline.  Psychiatric:        Mood and Affect: Mood normal.        Behavior: Behavior normal.        Thought Content: Thought content normal.        Judgment: Judgment normal.      MD Evaluation Airway: WNL Heart: WNL Abdomen: WNL Chest/ Lungs: WNL ASA  Classification: 3 Mallampati/Airway Score: Two   Imaging: No results found.  Labs:  CBC: Recent Labs    02/07/23 1408 04/07/23 1428 04/25/23 0800  WBC 18.4* 8.0 7.7  HGB 13.5 13.1 12.8  HCT 39.8 38.1 38.6  PLT 571* 433* 448*    COAGS: No results for input(s): "INR", "APTT" in the last 8760 hours.  BMP: Recent Labs    02/07/23 1408 04/07/23 1428  NA 136 135  K 3.3* 3.4*  CL 99 98  CO2 29 30  GLUCOSE 106* 96  BUN 16 24*  CALCIUM 9.6 9.3   CREATININE 1.06* 1.04*  GFRNONAA 54* 55*    LIVER FUNCTION TESTS: Recent Labs    02/07/23 1408 04/07/23 1428  BILITOT 0.6 0.6  AST 15 15  ALT 11 12  ALKPHOS 51 58  PROT 7.2 6.8  ALBUMIN 4.7 4.3    TUMOR MARKERS: No results for input(s): "AFPTM", "CEA", "CA199", "CHROMGRNA" in the last 8760 hours.  Assessment and Plan: Patient with past medical history of HTN, asthma, HLD presents with complaint of new thrombocytosis.  IR consulted for bone marrow biopsy at the request of Dr. Leonides Schanz. Case reviewed by Dr. Fredia Sorrow who approves patient for procedure.  Patient presents today in their usual state of health.  She has been NPO and is not currently on blood thinners.   Risks and benefits was discussed with the patient and/or patient's family including, but not limited to bleeding, infection, damage to adjacent structures or low yield requiring additional tests.  All of the questions were answered and there is agreement to proceed.  Consent signed and in chart.  Advance Care Plan: The advanced care plan/surrogate decision maker was discussed at the time of visit and documented in the medical record.     Thank you for this interesting consult.  I greatly enjoyed meeting Elaine Roanhorse and look forward to participating in their care.  A copy of this report was sent to the requesting provider on this date.  Electronically Signed: Hoyt Koch, PA 04/25/2023, 8:30 AM   I spent a total of  30 Minutes   in face to face in clinical consultation, greater than 50% of which was counseling/coordinating care for thrombocytosis.

## 2023-04-25 ENCOUNTER — Ambulatory Visit (HOSPITAL_COMMUNITY)
Admission: RE | Admit: 2023-04-25 | Discharge: 2023-04-25 | Disposition: A | Discharge status: Home / Self Care | Payer: Medicare PPO | Source: Ambulatory Visit | Attending: Hematology and Oncology | Admitting: Hematology and Oncology

## 2023-04-25 ENCOUNTER — Ambulatory Visit (HOSPITAL_COMMUNITY)
Admission: RE | Admit: 2023-04-25 | Discharge: 2023-04-25 | Disposition: A | Discharge status: Home / Self Care | Payer: Medicare PPO | Source: Ambulatory Visit | Attending: Hematology and Oncology

## 2023-04-25 ENCOUNTER — Other Ambulatory Visit: Payer: Self-pay

## 2023-04-25 ENCOUNTER — Encounter (HOSPITAL_COMMUNITY): Payer: Self-pay

## 2023-04-25 DIAGNOSIS — K219 Gastro-esophageal reflux disease without esophagitis: Secondary | ICD-10-CM | POA: Diagnosis not present

## 2023-04-25 DIAGNOSIS — E785 Hyperlipidemia, unspecified: Secondary | ICD-10-CM | POA: Insufficient documentation

## 2023-04-25 DIAGNOSIS — J45909 Unspecified asthma, uncomplicated: Secondary | ICD-10-CM | POA: Diagnosis not present

## 2023-04-25 DIAGNOSIS — D75839 Thrombocytosis, unspecified: Secondary | ICD-10-CM | POA: Diagnosis not present

## 2023-04-25 DIAGNOSIS — Z87891 Personal history of nicotine dependence: Secondary | ICD-10-CM | POA: Diagnosis not present

## 2023-04-25 DIAGNOSIS — I10 Essential (primary) hypertension: Secondary | ICD-10-CM | POA: Insufficient documentation

## 2023-04-25 DIAGNOSIS — M8580 Other specified disorders of bone density and structure, unspecified site: Secondary | ICD-10-CM | POA: Diagnosis not present

## 2023-04-25 DIAGNOSIS — Z01818 Encounter for other preprocedural examination: Secondary | ICD-10-CM

## 2023-04-25 DIAGNOSIS — D469 Myelodysplastic syndrome, unspecified: Secondary | ICD-10-CM | POA: Insufficient documentation

## 2023-04-25 LAB — CBC WITH DIFFERENTIAL/PLATELET
Abs Immature Granulocytes: 0.05 10*3/uL (ref 0.00–0.07)
Basophils Absolute: 0.1 10*3/uL (ref 0.0–0.1)
Basophils Relative: 1 %
Eosinophils Absolute: 0.4 10*3/uL (ref 0.0–0.5)
Eosinophils Relative: 5 %
HCT: 38.6 % (ref 36.0–46.0)
Hemoglobin: 12.8 g/dL (ref 12.0–15.0)
Immature Granulocytes: 1 %
Lymphocytes Relative: 22 %
Lymphs Abs: 1.7 10*3/uL (ref 0.7–4.0)
MCH: 30.6 pg (ref 26.0–34.0)
MCHC: 33.2 g/dL (ref 30.0–36.0)
MCV: 92.3 fL (ref 80.0–100.0)
Monocytes Absolute: 0.5 10*3/uL (ref 0.1–1.0)
Monocytes Relative: 6 %
Neutro Abs: 5.1 10*3/uL (ref 1.7–7.7)
Neutrophils Relative %: 65 %
Platelets: 448 10*3/uL — ABNORMAL HIGH (ref 150–400)
RBC: 4.18 MIL/uL (ref 3.87–5.11)
RDW: 15.7 % — ABNORMAL HIGH (ref 11.5–15.5)
WBC: 7.7 10*3/uL (ref 4.0–10.5)
nRBC: 0 % (ref 0.0–0.2)

## 2023-04-25 MED ORDER — SODIUM CHLORIDE 0.9 % IV SOLN: INTRAVENOUS | Status: DC

## 2023-04-25 MED ORDER — FENTANYL CITRATE (PF) 100 MCG/2ML IJ SOLN
INTRAMUSCULAR | Status: AC
  Filled 2023-04-25: qty 2

## 2023-04-25 MED ORDER — MIDAZOLAM HCL 2 MG/2ML IJ SOLN
INTRAMUSCULAR | Status: AC
Start: 2023-04-25 — End: ?
  Filled 2023-04-25: qty 4

## 2023-04-25 MED ORDER — MIDAZOLAM HCL 2 MG/2ML IJ SOLN
INTRAMUSCULAR | Status: AC | PRN
  Administered 2023-04-25: 1 mg via INTRAVENOUS

## 2023-04-25 NOTE — Procedures (Signed)
Interventional Radiology Procedure Note  Procedure: CT guided bone marrow aspiration and biopsy  Complications: None  EBL: < 10 mL  Findings: Aspirate and core biopsy performed of bone marrow in right iliac bone.  Plan: Bedrest supine x 1 hrs  Cadell Gabrielson T. Fredia Sorrow, M.D Pager:  [phone]

## 2023-04-25 NOTE — Discharge Instructions (Signed)
For questions /concerns may call Interventional Radiology at [phone] or  Interventional Radiology clinic [phone]   You may remove your dressing and shower tomorrow afternoon                                                                                 Bone Marrow Aspiration and Bone Marrow Biopsy, Adult, Care After The following information offers guidance on how to care for yourself after your procedure. Your health care provider may also give you more specific instructions. If you have problems or questions, contact your health care provider. What can I expect after the procedure? After the procedure, it is common to have: Mild pain and tenderness. Swelling. Bruising. Follow these instructions at home: Incision care  Follow instructions from your health care provider about how to take care of the incision site. Make sure you: Wash your hands with soap and water for at least 20 seconds before and after you change your bandage (dressing). If soap and water are not available, use hand sanitizer. Change your dressing as told by your health care provider. Leave stitches (sutures), skin glue, or adhesive strips in place. These skin closures may need to stay in place for 2 weeks or longer. If adhesive strip edges start to loosen and curl up, you may trim the loose edges. Do not remove adhesive strips completely unless your health care provider tells you to do that. Check your incision site every day for signs of infection. Check for: More redness, swelling, or pain. Fluid or blood. Warmth. Pus or a bad smell. Activity Return to your normal activities as told by your health care provider. Ask your health care provider what activities are safe for you. Do not lift anything that is heavier than 10 lb (4.5 kg), or the limit that you are told, until your health care provider says that it is safe. If you were given a sedative during the procedure, it can affect you for several hours. Do  not drive or operate machinery until your health care provider says that it is safe. General instructions  Take over-the-counter and prescription medicines only as told by your health care provider. Do not take baths, swim, or use a hot tub until your health care provider approves. Ask your health care provider if you may take showers. You may only be allowed to take sponge baths. If directed, put ice on the affected area. To do this: Put ice in a plastic bag. Place a towel between your skin and the bag. Leave the ice on for 20 minutes, 2-3 times a day. If your skin turns bright red, remove the ice right away to prevent skin damage. The risk of skin damage is higher if you cannot feel pain, heat, or cold. Contact a health care provider if: You have signs of infection. Your pain is not controlled with medicine. You have cancer, and a temperature of 100.88F (38C) or higher. Get help right away if: You have a temperature of 101F (38.3C) or higher, or as told by your health care provider. You have bleeding from the incision site that cannot be controlled. This information is not intended to replace advice given to you by  your health care provider. Make sure you discuss any questions you have with your health care provider. Document Revised: 07/23/2021 Document Reviewed: 07/23/2021 Elsevier Patient Education  2024 Elsevier Inc.                                                                          Moderate Conscious Sedation, Adult, Care After After the procedure, it is common to have: Sleepiness for a few hours. Impaired judgment for a few hours. Trouble with balance. Nausea or vomiting if you eat too soon. Follow these instructions at home: For the time period you were told by your health care provider:  Rest. Do not participate in activities where you could fall or become injured. Do not drive or use machinery. Do not drink alcohol. Do not take sleeping pills or medicines that  cause drowsiness. Do not make important decisions or sign legal documents. Do not take care of children on your own. Eating and drinking Follow instructions from your health care provider about what you may eat and drink. Drink enough fluid to keep your urine pale yellow. If you vomit: Drink clear fluids slowly and in small amounts as you are able. Clear fluids include water, ice chips, low-calorie sports drinks, and fruit juice that has water added to it (diluted fruit juice). Eat light and bland foods in small amounts as you are able. These foods include bananas, applesauce, rice, lean meats, toast, and crackers. General instructions Take over-the-counter and prescription medicines only as told by your health care provider. Have a responsible adult stay with you for the time you are told. Do not use any products that contain nicotine or tobacco. These products include cigarettes, chewing tobacco, and vaping devices, such as e-cigarettes. If you need help quitting, ask your health care provider. Return to your normal activities as told by your health care provider. Ask your health care provider what activities are safe for you. Your health care provider may give you more instructions. Make sure you know what you can and cannot do. Contact a health care provider if: You are still sleepy or having trouble with balance after 24 hours. You feel light-headed. You vomit every time you eat or drink. You get a rash. You have a fever. You have redness or swelling around the IV site. Get help right away if: You have trouble breathing. You start to feel confused at home. These symptoms may be an emergency. Get help right away. Call 911. Do not wait to see if the symptoms will go away. Do not drive yourself to the hospital. This information is not intended to replace advice given to you by your health care provider. Make sure you discuss any questions you have with your health care  provider. Document Revised: 10/01/2021 Document Reviewed: 10/01/2021 Elsevier Patient Education  2024 [employer].

## 2023-04-28 DIAGNOSIS — F3342 Major depressive disorder, recurrent, in full remission: Secondary | ICD-10-CM | POA: Diagnosis not present

## 2023-04-28 DIAGNOSIS — F411 Generalized anxiety disorder: Secondary | ICD-10-CM | POA: Diagnosis not present

## 2023-04-29 LAB — SURGICAL PATHOLOGY

## 2023-05-01 ENCOUNTER — Telehealth: Payer: Self-pay | Admitting: Hematology and Oncology

## 2023-05-01 NOTE — Telephone Encounter (Signed)
Scheduled appointments per 1/29 scheduling message. Patient is aware of the made appointments via voice mail. Patient is active on MyChart and will be mailed an appointment reminder.

## 2023-05-02 ENCOUNTER — Ambulatory Visit (HOSPITAL_COMMUNITY): Payer: Medicare PPO

## 2023-05-05 ENCOUNTER — Telehealth: Payer: Self-pay | Admitting: Hematology and Oncology

## 2023-05-06 ENCOUNTER — Inpatient Hospital Stay: Payer: Medicare PPO | Attending: Hematology and Oncology | Admitting: Nurse Practitioner

## 2023-05-06 ENCOUNTER — Inpatient Hospital Stay: Payer: Medicare PPO

## 2023-05-06 ENCOUNTER — Encounter: Payer: Self-pay | Admitting: Nurse Practitioner

## 2023-05-06 ENCOUNTER — Other Ambulatory Visit: Payer: Self-pay | Admitting: Hematology and Oncology

## 2023-05-06 ENCOUNTER — Other Ambulatory Visit (HOSPITAL_COMMUNITY): Payer: Self-pay

## 2023-05-06 VITALS — BP 154/84 | HR 74 | Temp 97.4°F | Resp 18 | Wt 157.4 lb

## 2023-05-06 DIAGNOSIS — D75839 Thrombocytosis, unspecified: Secondary | ICD-10-CM

## 2023-05-06 DIAGNOSIS — Z79899 Other long term (current) drug therapy: Secondary | ICD-10-CM | POA: Insufficient documentation

## 2023-05-06 DIAGNOSIS — Z7982 Long term (current) use of aspirin: Secondary | ICD-10-CM | POA: Diagnosis not present

## 2023-05-06 DIAGNOSIS — Z7964 Long term (current) use of myelosuppressive agent: Secondary | ICD-10-CM | POA: Insufficient documentation

## 2023-05-06 DIAGNOSIS — D473 Essential (hemorrhagic) thrombocythemia: Secondary | ICD-10-CM | POA: Insufficient documentation

## 2023-05-06 DIAGNOSIS — Z8701 Personal history of pneumonia (recurrent): Secondary | ICD-10-CM | POA: Diagnosis not present

## 2023-05-06 DIAGNOSIS — Z90710 Acquired absence of both cervix and uterus: Secondary | ICD-10-CM | POA: Diagnosis not present

## 2023-05-06 DIAGNOSIS — D75838 Other thrombocytosis: Secondary | ICD-10-CM | POA: Diagnosis not present

## 2023-05-06 LAB — CMP (CANCER CENTER ONLY)
ALT: 13 U/L (ref 0–44)
AST: 17 U/L (ref 15–41)
Albumin: 4.3 g/dL (ref 3.5–5.0)
Alkaline Phosphatase: 55 U/L (ref 38–126)
Anion gap: 5 (ref 5–15)
BUN: 22 mg/dL (ref 8–23)
CO2: 31 mmol/L (ref 22–32)
Calcium: 9.4 mg/dL (ref 8.9–10.3)
Chloride: 101 mmol/L (ref 98–111)
Creatinine: 1.18 mg/dL — ABNORMAL HIGH (ref 0.44–1.00)
GFR, Estimated: 47 mL/min — ABNORMAL LOW (ref >60)
Glucose, Bld: 108 mg/dL — ABNORMAL HIGH (ref 70–99)
Potassium: 4.1 mmol/L (ref 3.5–5.1)
Sodium: 137 mmol/L (ref 135–145)
Total Bilirubin: 0.4 mg/dL (ref 0.0–1.2)
Total Protein: 6.6 g/dL (ref 6.5–8.1)

## 2023-05-06 LAB — CBC WITH DIFFERENTIAL (CANCER CENTER ONLY)
Abs Immature Granulocytes: 0.04 10*3/uL (ref 0.00–0.07)
Basophils Absolute: 0.1 10*3/uL (ref 0.0–0.1)
Basophils Relative: 2 %
Eosinophils Absolute: 0.3 10*3/uL (ref 0.0–0.5)
Eosinophils Relative: 4 %
HCT: 39 % (ref 36.0–46.0)
Hemoglobin: 12.9 g/dL (ref 12.0–15.0)
Immature Granulocytes: 1 %
Lymphocytes Relative: 24 %
Lymphs Abs: 1.9 10*3/uL (ref 0.7–4.0)
MCH: 30.3 pg (ref 26.0–34.0)
MCHC: 33.1 g/dL (ref 30.0–36.0)
MCV: 91.5 fL (ref 80.0–100.0)
Monocytes Absolute: 0.4 10*3/uL (ref 0.1–1.0)
Monocytes Relative: 6 %
Neutro Abs: 5 10*3/uL (ref 1.7–7.7)
Neutrophils Relative %: 63 %
Platelet Count: 505 10*3/uL — ABNORMAL HIGH (ref 150–400)
RBC: 4.26 MIL/uL (ref 3.87–5.11)
RDW: 15.4 % (ref 11.5–15.5)
WBC Count: 7.8 10*3/uL (ref 4.0–10.5)
nRBC: 0 % (ref 0.0–0.2)

## 2023-05-06 MED ORDER — HYDROXYUREA 500 MG PO CAPS
500.0000 mg | ORAL_CAPSULE | Freq: Every day | ORAL | 0 refills | Status: DC
  Filled 2023-05-06: qty 30, 30d supply, fill #0

## 2023-05-06 MED ORDER — ASPIRIN 81 MG PO TBEC
81.0000 mg | DELAYED_RELEASE_TABLET | Freq: Every day | ORAL | 0 refills | Status: AC
  Filled 2023-05-06: qty 30, 30d supply, fill #0

## 2023-05-06 NOTE — Progress Notes (Signed)
 Patient Care Team: [name], MD as PCP - General (Internal Medicine) [name], NP as Nurse Practitioner (Gynecology)   CHIEF COMPLAINT: Follow-up JAK2 positive ET  CURRENT THERAPY: Pending start of daily aspirin  and Hydrea   INTERVAL HISTORY Ms. Yust returns for follow-up as scheduled, last seen by Dr. Federico 04/07/23. She underwent bone marrow biopsy 04/25/23, healed well with some hip soreness. She is doing OK otherwise. Energy remains low. Has some issues with what she feels are balance and memory issues. Denies bruising/bleeding, new cough, dyspnea, leg edema, or signs of infection.  ROS  All other systems reviewed and negative  Past Medical History:  Diagnosis Date   Anemia    hx   Arthritis    hands, knees   Asthma    has not needed any med. in 2 yrs.   Balance problem    Chronic bronchitis (HCC)    Dental crowns present    Depression    GERD (gastroesophageal reflux disease)    Hypertension    under control, has been on med. x 3-4 yrs.   Lichen sclerosus    Migraines    Osteopenia 10/2017   T score -1.8 FRAX 12% / 2.3% excluding history of lower leg fracture falling downstairs.   Pneumonia    hx   Psoriasis    Seasonal allergies    Trimalleolar fracture of right ankle 09/16/2011     Past Surgical History:  Procedure Laterality Date   ABDOMINAL HYSTERECTOMY     partial   ANTERIOR CERVICAL DECOMP/DISCECTOMY FUSION N/A 11/02/2012   Procedure: ANTERIOR CERVICAL DECOMPRESSION/DISCECTOMY FUSION  cervical five-six cervical six-seven;  Surgeon: Victory Gens, MD;  Location: [facility];  Service: Neurosurgery;  Laterality: N/A;  Cervical five six  Cervical six-seven Anterior cervical decompression/diskectomy/fusion   cataract surgery Bilateral 05/2022   The other 05/2022   HAND SURGERY     rt strep infection   KNEE SURGERY     ORIF ANKLE FRACTURE  09/17/2011   Procedure: OPEN REDUCTION INTERNAL FIXATION (ORIF) ANKLE FRACTURE;  Surgeon: Eva Elsie Herring, MD;  Location: [facility];  Service: Orthopedics;  Laterality: Right;   TONSILLECTOMY       Outpatient Encounter Medications as of 05/06/2023  Medication Sig Note   Adalimumab (HUMIRA [place]) Inject into the skin.    albuterol  (PROVENTIL  HFA;VENTOLIN  HFA) 108 (90 BASE) MCG/ACT inhaler Inhale 2 puffs into the lungs every 6 (six) hours as needed for shortness of breath. 10/22/2012: Pt doesn't currently have one and needs another   ALPRAZolam (XANAX) 0.25 MG tablet     aspirin  EC 81 MG tablet Take 1 tablet (81 mg total) by mouth daily. Swallow whole.    aspirin -acetaminophen -caffeine (EXCEDRIN MIGRAINE) 250-250-65 MG tablet Take 1 tablet by mouth every 6 (six) hours as needed for headache.    buPROPion  (WELLBUTRIN  SR) 100 MG 12 hr tablet Take 100 mg by mouth daily. 08/01/2021: Unsure of dosage   cetirizine (ZYRTEC) 10 MG tablet Take 10 mg by mouth daily.    clobetasol  cream (TEMOVATE ) 0.05 % Apply 1 Application topically 2 (two) times a week.    estradiol  (ESTRACE  VAGINAL) 0.1 MG/GM vaginal cream Place 1 g vaginally 2 (two) times a week.    fluticasone  (FLONASE ) 50 MCG/ACT nasal spray Place 2 sprays into the nose daily as needed for rhinitis.    halobetasol (ULTRAVATE) 0.05 % cream Apply 1 application topically daily.     hydrocortisone  (ANUSOL -HC) 2.5 % rectal cream Place  1 application rectally 2 (two) times daily.    [START ON 05/12/2023] hydroxyurea  (HYDREA ) 500 MG capsule Take 1 capsule (500 mg total) by mouth daily. May take with food to minimize GI side effects.    LINZESS 290 MCG CAPS capsule Take 290 mcg by mouth daily.    losartan -hydrochlorothiazide  (HYZAAR) 100-25 MG per tablet Take 1 tablet by mouth daily.    omeprazole (PRILOSEC) 20 MG capsule Take 20 mg by mouth 2 (two) times daily.    sertraline (ZOLOFT) 100 MG tablet Take 150 mg by mouth daily.    SUMAtriptan  (IMITREX ) 100 MG tablet Take 100 mg by mouth every 2 (two) hours as needed for migraine. For  headache    No facility-administered encounter medications on file as of 05/06/2023.     Today's Vitals   05/06/23 0958  BP: (!) 154/84  Pulse: 74  Resp: 18  Temp: (!) 97.4 F (36.3 C)  TempSrc: Temporal  SpO2: 99%  Weight: 157 lb 7 oz (71.4 kg)  PainSc: 3    Body mass index is 26.2 kg/m.   PHYSICAL EXAM GENERAL:alert, no distress and comfortable SKIN: no rash  EYES: sclera clear NECK: without mass LYMPH:  no palpable cervical or supraclavicular lymphadenopathy  LUNGS: clear with normal breathing effort HEART: regular rate & rhythm, no lower extremity edema ABDOMEN: abdomen soft, non-tender and normal bowel sounds NEURO: alert & oriented x 3 with fluent speech, no focal motor/sensory deficits     CBC    Component Value Date/Time   WBC 7.8 05/06/2023 0920   WBC 7.7 04/25/2023 0800   RBC 4.26 05/06/2023 0920   HGB 12.9 05/06/2023 0920   HCT 39.0 05/06/2023 0920   PLT 505 (H) 05/06/2023 0920   MCV 91.5 05/06/2023 0920   MCH 30.3 05/06/2023 0920   MCHC 33.1 05/06/2023 0920   RDW 15.4 05/06/2023 0920   LYMPHSABS 1.9 05/06/2023 0920   MONOABS 0.4 05/06/2023 0920   EOSABS 0.3 05/06/2023 0920   BASOSABS 0.1 05/06/2023 0920     CMP     Component Value Date/Time   NA 137 05/06/2023 0920   K 4.1 05/06/2023 0920   CL 101 05/06/2023 0920   CO2 31 05/06/2023 0920   GLUCOSE 108 (H) 05/06/2023 0920   BUN 22 05/06/2023 0920   CREATININE 1.18 (H) 05/06/2023 0920   CALCIUM 9.4 05/06/2023 0920   PROT 6.6 05/06/2023 0920   ALBUMIN 4.3 05/06/2023 0920   AST 17 05/06/2023 0920   ALT 13 05/06/2023 0920   ALKPHOS 55 05/06/2023 0920   BILITOT 0.4 05/06/2023 0920   GFRNONAA 47 (L) 05/06/2023 0920   GFRAA 74 (L) 10/27/2012 0946     ASSESSMENT & PLAN: 79 year old female  Essential thrombocytosis, JAK2 positive -Found to have elevated platelet to 571 in 01/2023, no history of thrombosis -Workup per Dr. Federico ruled out secondary causes such as inflammation and iron  deficiency -Bone marrow biopsy 04/25/2023 showed hypercellular marrow for age with trilineage hematopoiesis with morphologic findings supportive of MPN -NGS shows JAK2 mutation, consistent with essential thrombocytosis -I reviewed the above results, the nature of MPN/ET and the risk of thrombosis -Given her age >11 and JAK2 + the recommendation is to take baby aspirin  81 mg daily and start Hydroxyurea  500 mg po daily.  Potential risk and benefit and side effects including but not limited to GI, skin and mouth ulcers, and bone marrow suppression reviewed.  She agrees to proceed -I answered many questions to her satisfaction -Return for  lab in 2 in 4 weeks with a follow-up with Dr. Federico in 4 weeks -She knows to call sooner with any new or concerning changes or side effects. -The case was discussed with Dr. Federico prior to the visit   PLAN: -Bone marrow results, NGS, and today's labs reviewed -Begin ASA 81 mg po daily and Hydrea  500 mg po daily, Rx's sent -Lab 2 and 4 weeks after starting Hydrea  -F/up 4 weeks after starting, or sooner if needed  -Case reviewed with Dr. Federico    All questions were answered. The patient knows to call the clinic with any problems, questions or concerns. No barriers to learning were detected. I spent 30 minutes counseling the patient face to face. The total time spent in the appointment was 40 minutes and more than 50% was on counseling, review of test results, and coordination of care.   Esdras Delair, NP-C 05/06/2023

## 2023-05-08 ENCOUNTER — Other Ambulatory Visit (HOSPITAL_COMMUNITY): Payer: Self-pay

## 2023-05-11 LAB — MISC LABCORP TEST (SEND OUT): Labcorp test code: 489514

## 2023-05-12 ENCOUNTER — Telehealth: Payer: Self-pay | Admitting: Nurse Practitioner

## 2023-05-12 NOTE — Telephone Encounter (Signed)
 Scheduled appointments per 2/4 los. Patient is aware of the made appointments.

## 2023-05-20 ENCOUNTER — Ambulatory Visit: Payer: Medicare PPO | Admitting: Physician Assistant

## 2023-05-20 ENCOUNTER — Other Ambulatory Visit: Payer: Medicare PPO

## 2023-05-26 DIAGNOSIS — F411 Generalized anxiety disorder: Secondary | ICD-10-CM | POA: Diagnosis not present

## 2023-05-26 DIAGNOSIS — F3342 Major depressive disorder, recurrent, in full remission: Secondary | ICD-10-CM | POA: Diagnosis not present

## 2023-05-27 ENCOUNTER — Other Ambulatory Visit: Payer: Self-pay | Admitting: Hematology and Oncology

## 2023-05-27 ENCOUNTER — Inpatient Hospital Stay: Payer: Medicare PPO

## 2023-05-27 DIAGNOSIS — Z90710 Acquired absence of both cervix and uterus: Secondary | ICD-10-CM | POA: Diagnosis not present

## 2023-05-27 DIAGNOSIS — R6883 Chills (without fever): Secondary | ICD-10-CM | POA: Diagnosis not present

## 2023-05-27 DIAGNOSIS — Z1589 Genetic susceptibility to other disease: Secondary | ICD-10-CM | POA: Diagnosis not present

## 2023-05-27 DIAGNOSIS — L4050 Arthropathic psoriasis, unspecified: Secondary | ICD-10-CM | POA: Diagnosis not present

## 2023-05-27 DIAGNOSIS — F329 Major depressive disorder, single episode, unspecified: Secondary | ICD-10-CM | POA: Diagnosis not present

## 2023-05-27 DIAGNOSIS — D75839 Thrombocytosis, unspecified: Secondary | ICD-10-CM

## 2023-05-27 DIAGNOSIS — R42 Dizziness and giddiness: Secondary | ICD-10-CM | POA: Diagnosis not present

## 2023-05-27 DIAGNOSIS — N179 Acute kidney failure, unspecified: Secondary | ICD-10-CM | POA: Diagnosis not present

## 2023-05-27 DIAGNOSIS — Z7964 Long term (current) use of myelosuppressive agent: Secondary | ICD-10-CM | POA: Diagnosis not present

## 2023-05-27 DIAGNOSIS — D75838 Other thrombocytosis: Secondary | ICD-10-CM | POA: Diagnosis not present

## 2023-05-27 DIAGNOSIS — Z8701 Personal history of pneumonia (recurrent): Secondary | ICD-10-CM | POA: Diagnosis not present

## 2023-05-27 DIAGNOSIS — I10 Essential (primary) hypertension: Secondary | ICD-10-CM | POA: Diagnosis not present

## 2023-05-27 DIAGNOSIS — Z7982 Long term (current) use of aspirin: Secondary | ICD-10-CM | POA: Diagnosis not present

## 2023-05-27 DIAGNOSIS — E7800 Pure hypercholesterolemia, unspecified: Secondary | ICD-10-CM | POA: Diagnosis not present

## 2023-05-27 DIAGNOSIS — D473 Essential (hemorrhagic) thrombocythemia: Secondary | ICD-10-CM | POA: Diagnosis not present

## 2023-05-27 DIAGNOSIS — Z79899 Other long term (current) drug therapy: Secondary | ICD-10-CM | POA: Diagnosis not present

## 2023-05-27 DIAGNOSIS — R7301 Impaired fasting glucose: Secondary | ICD-10-CM | POA: Diagnosis not present

## 2023-05-27 LAB — CBC WITH DIFFERENTIAL (CANCER CENTER ONLY)
Abs Immature Granulocytes: 0.03 10*3/uL (ref 0.00–0.07)
Basophils Absolute: 0.1 10*3/uL (ref 0.0–0.1)
Basophils Relative: 1 %
Eosinophils Absolute: 0.3 10*3/uL (ref 0.0–0.5)
Eosinophils Relative: 4 %
HCT: 39.2 % (ref 36.0–46.0)
Hemoglobin: 13.4 g/dL (ref 12.0–15.0)
Immature Granulocytes: 0 %
Lymphocytes Relative: 23 %
Lymphs Abs: 1.7 10*3/uL (ref 0.7–4.0)
MCH: 30.9 pg (ref 26.0–34.0)
MCHC: 34.2 g/dL (ref 30.0–36.0)
MCV: 90.3 fL (ref 80.0–100.0)
Monocytes Absolute: 0.4 10*3/uL (ref 0.1–1.0)
Monocytes Relative: 6 %
Neutro Abs: 5.1 10*3/uL (ref 1.7–7.7)
Neutrophils Relative %: 66 %
Platelet Count: 455 10*3/uL — ABNORMAL HIGH (ref 150–400)
RBC: 4.34 MIL/uL (ref 3.87–5.11)
RDW: 16.1 % — ABNORMAL HIGH (ref 11.5–15.5)
WBC Count: 7.7 10*3/uL (ref 4.0–10.5)
nRBC: 0 % (ref 0.0–0.2)

## 2023-05-27 LAB — CMP (CANCER CENTER ONLY)
ALT: 13 U/L (ref 0–44)
AST: 16 U/L (ref 15–41)
Albumin: 4.5 g/dL (ref 3.5–5.0)
Alkaline Phosphatase: 57 U/L (ref 38–126)
Anion gap: 7 (ref 5–15)
BUN: 17 mg/dL (ref 8–23)
CO2: 30 mmol/L (ref 22–32)
Calcium: 9.4 mg/dL (ref 8.9–10.3)
Chloride: 95 mmol/L — ABNORMAL LOW (ref 98–111)
Creatinine: 1.07 mg/dL — ABNORMAL HIGH (ref 0.44–1.00)
GFR, Estimated: 53 mL/min — ABNORMAL LOW (ref >60)
Glucose, Bld: 131 mg/dL — ABNORMAL HIGH (ref 70–99)
Potassium: 4.1 mmol/L (ref 3.5–5.1)
Sodium: 132 mmol/L — ABNORMAL LOW (ref 135–145)
Total Bilirubin: 0.7 mg/dL (ref 0.0–1.2)
Total Protein: 7 g/dL (ref 6.5–8.1)

## 2023-05-29 DIAGNOSIS — Z1231 Encounter for screening mammogram for malignant neoplasm of breast: Secondary | ICD-10-CM | POA: Diagnosis not present

## 2023-06-09 DIAGNOSIS — F3342 Major depressive disorder, recurrent, in full remission: Secondary | ICD-10-CM | POA: Diagnosis not present

## 2023-06-09 DIAGNOSIS — F411 Generalized anxiety disorder: Secondary | ICD-10-CM | POA: Diagnosis not present

## 2023-06-12 ENCOUNTER — Inpatient Hospital Stay: Payer: Medicare PPO | Admitting: Hematology and Oncology

## 2023-06-12 ENCOUNTER — Inpatient Hospital Stay: Payer: Medicare PPO | Attending: Hematology and Oncology

## 2023-06-12 ENCOUNTER — Other Ambulatory Visit: Payer: Self-pay | Admitting: Hematology and Oncology

## 2023-06-12 DIAGNOSIS — Z79899 Other long term (current) drug therapy: Secondary | ICD-10-CM | POA: Insufficient documentation

## 2023-06-12 DIAGNOSIS — Z808 Family history of malignant neoplasm of other organs or systems: Secondary | ICD-10-CM | POA: Diagnosis not present

## 2023-06-12 DIAGNOSIS — Z8042 Family history of malignant neoplasm of prostate: Secondary | ICD-10-CM | POA: Insufficient documentation

## 2023-06-12 DIAGNOSIS — D75838 Other thrombocytosis: Secondary | ICD-10-CM | POA: Diagnosis not present

## 2023-06-12 DIAGNOSIS — Z885 Allergy status to narcotic agent status: Secondary | ICD-10-CM | POA: Insufficient documentation

## 2023-06-12 DIAGNOSIS — D473 Essential (hemorrhagic) thrombocythemia: Secondary | ICD-10-CM

## 2023-06-12 DIAGNOSIS — R519 Headache, unspecified: Secondary | ICD-10-CM | POA: Insufficient documentation

## 2023-06-12 DIAGNOSIS — Z7982 Long term (current) use of aspirin: Secondary | ICD-10-CM | POA: Diagnosis not present

## 2023-06-12 DIAGNOSIS — Z87891 Personal history of nicotine dependence: Secondary | ICD-10-CM | POA: Insufficient documentation

## 2023-06-12 DIAGNOSIS — D75839 Thrombocytosis, unspecified: Secondary | ICD-10-CM

## 2023-06-12 DIAGNOSIS — Z823 Family history of stroke: Secondary | ICD-10-CM | POA: Insufficient documentation

## 2023-06-12 DIAGNOSIS — Z800 Family history of malignant neoplasm of digestive organs: Secondary | ICD-10-CM | POA: Insufficient documentation

## 2023-06-12 DIAGNOSIS — Z833 Family history of diabetes mellitus: Secondary | ICD-10-CM | POA: Diagnosis not present

## 2023-06-12 DIAGNOSIS — Z8249 Family history of ischemic heart disease and other diseases of the circulatory system: Secondary | ICD-10-CM | POA: Insufficient documentation

## 2023-06-12 DIAGNOSIS — Z90710 Acquired absence of both cervix and uterus: Secondary | ICD-10-CM | POA: Diagnosis not present

## 2023-06-12 LAB — CMP (CANCER CENTER ONLY)
ALT: 14 U/L (ref 0–44)
AST: 16 U/L (ref 15–41)
Albumin: 4.2 g/dL (ref 3.5–5.0)
Alkaline Phosphatase: 56 U/L (ref 38–126)
Anion gap: 9 (ref 5–15)
BUN: 24 mg/dL — ABNORMAL HIGH (ref 8–23)
CO2: 27 mmol/L (ref 22–32)
Calcium: 9.4 mg/dL (ref 8.9–10.3)
Chloride: 98 mmol/L (ref 98–111)
Creatinine: 1.12 mg/dL — ABNORMAL HIGH (ref 0.44–1.00)
GFR, Estimated: 50 mL/min — ABNORMAL LOW (ref >60)
Glucose, Bld: 120 mg/dL — ABNORMAL HIGH (ref 70–99)
Potassium: 3.8 mmol/L (ref 3.5–5.1)
Sodium: 134 mmol/L — ABNORMAL LOW (ref 135–145)
Total Bilirubin: 0.5 mg/dL (ref 0.0–1.2)
Total Protein: 7.2 g/dL (ref 6.5–8.1)

## 2023-06-12 LAB — CBC WITH DIFFERENTIAL (CANCER CENTER ONLY)
Abs Immature Granulocytes: 0.03 10*3/uL (ref 0.00–0.07)
Basophils Absolute: 0.1 10*3/uL (ref 0.0–0.1)
Basophils Relative: 2 %
Eosinophils Absolute: 0.3 10*3/uL (ref 0.0–0.5)
Eosinophils Relative: 4 %
HCT: 39 % (ref 36.0–46.0)
Hemoglobin: 13.2 g/dL (ref 12.0–15.0)
Immature Granulocytes: 0 %
Lymphocytes Relative: 23 %
Lymphs Abs: 1.6 10*3/uL (ref 0.7–4.0)
MCH: 31.4 pg (ref 26.0–34.0)
MCHC: 33.8 g/dL (ref 30.0–36.0)
MCV: 92.9 fL (ref 80.0–100.0)
Monocytes Absolute: 0.4 10*3/uL (ref 0.1–1.0)
Monocytes Relative: 6 %
Neutro Abs: 4.7 10*3/uL (ref 1.7–7.7)
Neutrophils Relative %: 65 %
Platelet Count: 396 10*3/uL (ref 150–400)
RBC: 4.2 MIL/uL (ref 3.87–5.11)
RDW: 17.2 % — ABNORMAL HIGH (ref 11.5–15.5)
WBC Count: 7.2 10*3/uL (ref 4.0–10.5)
nRBC: 0 % (ref 0.0–0.2)

## 2023-06-12 MED ORDER — HYDROXYUREA 500 MG PO CAPS: 500.0000 mg | ORAL_CAPSULE | Freq: Every day | ORAL | 1 refills | Status: DC

## 2023-06-12 NOTE — Progress Notes (Signed)
 [facility] [phone]   [phone]  PROGRESS NOTE  Patient Care Team: Ollen Bowl, MD as PCP - General (Internal Medicine) Olivia Mackie, NP as Nurse Practitioner (Gynecology)  Hematological/Oncological History # Essential Thrombocytosis, JAK2 Positive  02/07/2023: White blood cell 18.4, hemoglobin 13.5, MCV 89.2, platelets 571 04/07/2023: Establish care with Dr. Leonides Schanz  Interval History:  Kim Ferrell 78 y.o. female with medical history significant for essential thrombocytosis, JAK2 positive who presents for a follow up visit. The patient's last visit was on 05/06/2023 at which time she started aspirin and hydroxyurea. In the interim since the last visit she is continued on these medications without difficulty.  On exam today Kim Ferrell reports that she feels quite well.  She reports that she has been taking the aspirin without any bleeding, bruising, or dark stools.  She reports that she did run out of the hydroxyurea for about 1 week time when her "dog got to it".  She reports that she has not had any nausea, vomiting, or diarrhea.  She did have an ulcer on her tongue but no other ulcers in her mouth or on her ankles.  She reports her energy is about a 5 out of 10.  She does occasionally have a headache in the morning.  She reports that her appetite is good and she said no recent infectious symptoms such as fevers, chills, sweats, runny nose, sore throat, or cough.  A full 10 point ROS is otherwise negative.  She is willing and able to proceed with hydroxyurea therapy at this time.  MEDICAL HISTORY:  Past Medical History:  Diagnosis Date   Anemia    hx   Arthritis    hands, knees   Asthma    has not needed any med. in 2 yrs.   Balance problem    Chronic bronchitis (HCC)    Dental crowns present    Depression    GERD (gastroesophageal reflux disease)    Hypertension    under control, has been on med. x 3-4 yrs.   Lichen sclerosus     Migraines    Osteopenia 10/2017   T score -1.8 FRAX 12% / 2.3% excluding history of lower leg fracture falling downstairs.   Pneumonia    hx   Psoriasis    Seasonal allergies    Trimalleolar fracture of right ankle 09/16/2011    SURGICAL HISTORY: Past Surgical History:  Procedure Laterality Date   ABDOMINAL HYSTERECTOMY     partial   ANTERIOR CERVICAL DECOMP/DISCECTOMY FUSION N/A 11/02/2012   Procedure: ANTERIOR CERVICAL DECOMPRESSION/DISCECTOMY FUSION  cervical five-six cervical six-seven;  Surgeon: Barnett Abu, MD;  Location: [facility];  Service: Neurosurgery;  Laterality: N/A;  Cervical five six  Cervical six-seven Anterior cervical decompression/diskectomy/fusion   cataract surgery Bilateral 05/2022   The other 05/2022   HAND SURGERY     rt strep infection   KNEE SURGERY     ORIF ANKLE FRACTURE  09/17/2011   Procedure: OPEN REDUCTION INTERNAL FIXATION (ORIF) ANKLE FRACTURE;  Surgeon: Mable Paris, MD;  Location: [facility];  Service: Orthopedics;  Laterality: Right;   TONSILLECTOMY      SOCIAL HISTORY: Social History   Socioeconomic History   Marital status: Married    Spouse name: Not on file   Number of children: Not on file   Years of education: Not on file   Highest education level: Not on file  Occupational History   Not on file  Tobacco Use   Smoking status: Former    Current packs/day: 0.00    Average packs/day: 1 pack/day for 5.0 years (5.0 ttl pk-yrs)    Types: Cigarettes    Start date: 10/27/1969    Quit date: 10/28/1974    Years since quitting: 48.6   Smokeless tobacco: Never   Tobacco comments:    quit smoking 36 yrs. ago  Vaping Use   Vaping status: Never Used  Substance and Sexual Activity   Alcohol use: Yes    Comment:  3 glasses of wine daily   Drug use: No   Sexual activity: Not Currently    Birth control/protection: Surgical    Comment: Hyst; First IC >16 y/o, <5 Partners  Other Topics Concern   Not on file   Social History Narrative   Not on file   Social Drivers of Health   Financial Resource Strain: Not on file  Food Insecurity: Not on file  Transportation Needs: Not on file  Physical Activity: Not on file  Stress: Not on file  Social Connections: Not on file  Intimate Partner Violence: Not on file    FAMILY HISTORY: Family History  Problem Relation Age of Onset   Diabetes Mother    Heart disease Mother    Cancer Father        skin   Heart disease Father    Stroke Sister    Heart disease Brother    Cancer Brother        Colon,Liver,Prostate    ALLERGIES:  is allergic to codeine, adhesive [tape], gold-containing drug products, neomycin-bacitracin zn-polymyx, nickel, and silver.  MEDICATIONS:  Current Outpatient Medications  Medication Sig Dispense Refill   Adalimumab (HUMIRA [place]) Inject into the skin.     albuterol (PROVENTIL HFA;VENTOLIN HFA) 108 (90 BASE) MCG/ACT inhaler Inhale 2 puffs into the lungs every 6 (six) hours as needed for shortness of breath.     ALPRAZolam (XANAX) 0.25 MG tablet      aspirin EC 81 MG tablet Take 1 tablet (81 mg total) by mouth daily. Swallow whole. 30 tablet 0   aspirin-acetaminophen-caffeine (EXCEDRIN MIGRAINE) 250-250-65 MG tablet Take 1 tablet by mouth every 6 (six) hours as needed for headache.     buPROPion (WELLBUTRIN SR) 100 MG 12 hr tablet Take 100 mg by mouth daily.     cetirizine (ZYRTEC) 10 MG tablet Take 10 mg by mouth daily.     clobetasol cream (TEMOVATE) 0.05 % Apply 1 Application topically 2 (two) times a week. 60 g 1   estradiol (ESTRACE VAGINAL) 0.1 MG/GM vaginal cream Place 1 g vaginally 2 (two) times a week. 42.5 g 1   fluticasone (FLONASE) 50 MCG/ACT nasal spray Place 2 sprays into the nose daily as needed for rhinitis.     halobetasol (ULTRAVATE) 0.05 % cream Apply 1 application topically daily.      hydrocortisone (ANUSOL-HC) 2.5 % rectal cream Place 1 application rectally 2 (two) times daily. 30 g 0   hydroxyurea  (HYDREA) 500 MG capsule Take 1 capsule (500 mg total) by mouth daily. May take with food to minimize GI side effects. 90 capsule 1   LINZESS 290 MCG CAPS capsule Take 290 mcg by mouth daily.     losartan-hydrochlorothiazide (HYZAAR) 100-25 MG per tablet Take 1 tablet by mouth daily.     omeprazole (PRILOSEC) 20 MG capsule Take 20 mg by mouth 2 (two) times daily.     sertraline (ZOLOFT) 100 MG tablet Take 150 mg by mouth  daily.     SUMAtriptan (IMITREX) 100 MG tablet Take 100 mg by mouth every 2 (two) hours as needed for migraine. For headache     No current facility-administered medications for this visit.    REVIEW OF SYSTEMS:   Constitutional: ( - ) fevers, ( - )  chills , ( - ) night sweats Eyes: ( - ) blurriness of vision, ( - ) double vision, ( - ) watery eyes Ears, nose, mouth, throat, and face: ( - ) mucositis, ( - ) sore throat Respiratory: ( - ) cough, ( - ) dyspnea, ( - ) wheezes Cardiovascular: ( - ) palpitation, ( - ) chest discomfort, ( - ) lower extremity swelling Gastrointestinal:  ( - ) nausea, ( - ) heartburn, ( - ) change in bowel habits Skin: ( - ) abnormal skin rashes Lymphatics: ( - ) new lymphadenopathy, ( - ) easy bruising Neurological: ( - ) numbness, ( - ) tingling, ( - ) new weaknesses Behavioral/Psych: ( - ) mood change, ( - ) new changes  All other systems were reviewed with the patient and are negative.  PHYSICAL EXAMINATION:  Vitals:   06/12/23 0919  BP: (!) 148/91  Pulse: 81  Resp: 16  Temp: 97.7 F (36.5 C)  SpO2: 95%   Filed Weights   06/12/23 0919  Weight: 158 lb (71.7 kg)    GENERAL: Well-appearing elderly Caucasian female, alert, no distress and comfortable SKIN: skin color, texture, turgor are normal, no rashes or significant lesions EYES: conjunctiva are pink and non-injected, sclera clear LUNGS: clear to auscultation and percussion with normal breathing effort HEART: regular rate & rhythm and no murmurs and no lower extremity  edema Musculoskeletal: no cyanosis of digits and no clubbing  PSYCH: alert & oriented x 3, fluent speech NEURO: no focal motor/sensory deficits  LABORATORY DATA:  I have reviewed the data as listed    Latest Ref Rng & Units 06/12/2023    8:53 AM 05/27/2023    1:37 PM 05/06/2023    9:20 AM  CBC  WBC 4.0 - 10.5 K/uL 7.2  7.7  7.8   Hemoglobin 12.0 - 15.0 g/dL 95.2  84.1  32.4   Hematocrit 36.0 - 46.0 % 39.0  39.2  39.0   Platelets 150 - 400 K/uL 396  455  505        Latest Ref Rng & Units 06/12/2023    8:53 AM 05/27/2023    1:37 PM 05/06/2023    9:20 AM  CMP  Glucose 70 - 99 mg/dL 401  027  253   BUN 8 - 23 mg/dL 24  17  22    Creatinine 0.44 - 1.00 mg/dL 6.64  4.03  4.74   Sodium 135 - 145 mmol/L 134  132  137   Potassium 3.5 - 5.1 mmol/L 3.8  4.1  4.1   Chloride 98 - 111 mmol/L 98  95  101   CO2 22 - 32 mmol/L 27  30  31    Calcium 8.9 - 10.3 mg/dL 9.4  9.4  9.4   Total Protein 6.5 - 8.1 g/dL 7.2  7.0  6.6   Total Bilirubin 0.0 - 1.2 mg/dL 0.5  0.7  0.4   Alkaline Phos 38 - 126 U/L 56  57  55   AST 15 - 41 U/L 16  16  17    ALT 0 - 44 U/L 14  13  13      RADIOGRAPHIC STUDIES: No results found.  ASSESSMENT & PLAN Kim Ferrell 78 y.o. female with medical history significant for essential thrombocytosis, JAK2 positive who presents for a follow up visit.   # Essential Thrombocytosis, JAK2 positive -- Diagnosis confirmed with bone marrow biopsy.  Patient found to have JAK2 mutation on NGS panel -- Patient currently taking aspirin 81 mg p.o. daily in addition to hydroxyurea 500 mg p.o. daily -- Patient is tolerating treatment well with no major side effects. -- Labs today show white blood cell count 7.2, hemoglobin 13.2, MCV 92.9, platelets 396 -- RTC in 3 months for continued monitoring on hydroxyurea therapy.  No orders of the defined types were placed in this encounter.   All questions were answered. The patient knows to call the clinic with any problems, questions or  concerns.  A total of more than 30 minutes were spent on this encounter with face-to-face time and non-face-to-face time, including preparing to see the patient, ordering tests and/or medications, counseling the patient and coordination of care as outlined above.   Ulysees Barns, MD Department of Hematology/Oncology [facility] Phone: [phone] Pager: [phone] Email: [email or web address]  06/12/2023 3:50 PM

## 2023-07-07 DIAGNOSIS — F411 Generalized anxiety disorder: Secondary | ICD-10-CM | POA: Diagnosis not present

## 2023-07-07 DIAGNOSIS — F3342 Major depressive disorder, recurrent, in full remission: Secondary | ICD-10-CM | POA: Diagnosis not present

## 2023-07-09 DIAGNOSIS — H04123 Dry eye syndrome of bilateral lacrimal glands: Secondary | ICD-10-CM | POA: Diagnosis not present

## 2023-07-09 DIAGNOSIS — Z961 Presence of intraocular lens: Secondary | ICD-10-CM | POA: Diagnosis not present

## 2023-07-09 DIAGNOSIS — H5212 Myopia, left eye: Secondary | ICD-10-CM | POA: Diagnosis not present

## 2023-07-09 DIAGNOSIS — H26493 Other secondary cataract, bilateral: Secondary | ICD-10-CM | POA: Diagnosis not present

## 2023-07-09 DIAGNOSIS — H524 Presbyopia: Secondary | ICD-10-CM | POA: Diagnosis not present

## 2023-07-10 ENCOUNTER — Other Ambulatory Visit: Payer: Self-pay | Admitting: Hematology and Oncology

## 2023-07-10 ENCOUNTER — Other Ambulatory Visit: Payer: Self-pay

## 2023-07-10 ENCOUNTER — Inpatient Hospital Stay: Attending: Hematology and Oncology

## 2023-07-10 DIAGNOSIS — D75838 Other thrombocytosis: Secondary | ICD-10-CM | POA: Diagnosis not present

## 2023-07-10 DIAGNOSIS — D75839 Thrombocytosis, unspecified: Secondary | ICD-10-CM

## 2023-07-10 DIAGNOSIS — D473 Essential (hemorrhagic) thrombocythemia: Secondary | ICD-10-CM

## 2023-07-10 DIAGNOSIS — Z79899 Other long term (current) drug therapy: Secondary | ICD-10-CM | POA: Insufficient documentation

## 2023-07-10 LAB — CMP (CANCER CENTER ONLY)
ALT: 12 U/L (ref 0–44)
AST: 14 U/L — ABNORMAL LOW (ref 15–41)
Albumin: 4.5 g/dL (ref 3.5–5.0)
Alkaline Phosphatase: 53 U/L (ref 38–126)
Anion gap: 7 (ref 5–15)
BUN: 19 mg/dL (ref 8–23)
CO2: 30 mmol/L (ref 22–32)
Calcium: 9.2 mg/dL (ref 8.9–10.3)
Chloride: 96 mmol/L — ABNORMAL LOW (ref 98–111)
Creatinine: 1.06 mg/dL — ABNORMAL HIGH (ref 0.44–1.00)
GFR, Estimated: 54 mL/min — ABNORMAL LOW (ref >60)
Glucose, Bld: 101 mg/dL — ABNORMAL HIGH (ref 70–99)
Potassium: 3.7 mmol/L (ref 3.5–5.1)
Sodium: 133 mmol/L — ABNORMAL LOW (ref 135–145)
Total Bilirubin: 0.4 mg/dL (ref 0.0–1.2)
Total Protein: 7.1 g/dL (ref 6.5–8.1)

## 2023-07-10 LAB — CBC WITH DIFFERENTIAL (CANCER CENTER ONLY)
Abs Immature Granulocytes: 0.02 10*3/uL (ref 0.00–0.07)
Basophils Absolute: 0.1 10*3/uL (ref 0.0–0.1)
Basophils Relative: 1 %
Eosinophils Absolute: 0.2 10*3/uL (ref 0.0–0.5)
Eosinophils Relative: 4 %
HCT: 37.2 % (ref 36.0–46.0)
Hemoglobin: 12.9 g/dL (ref 12.0–15.0)
Immature Granulocytes: 0 %
Lymphocytes Relative: 23 %
Lymphs Abs: 1.4 10*3/uL (ref 0.7–4.0)
MCH: 32 pg (ref 26.0–34.0)
MCHC: 34.7 g/dL (ref 30.0–36.0)
MCV: 92.3 fL (ref 80.0–100.0)
Monocytes Absolute: 0.4 10*3/uL (ref 0.1–1.0)
Monocytes Relative: 7 %
Neutro Abs: 3.9 10*3/uL (ref 1.7–7.7)
Neutrophils Relative %: 65 %
Platelet Count: 421 10*3/uL — ABNORMAL HIGH (ref 150–400)
RBC: 4.03 MIL/uL (ref 3.87–5.11)
RDW: 18.1 % — ABNORMAL HIGH (ref 11.5–15.5)
WBC Count: 6 10*3/uL (ref 4.0–10.5)
nRBC: 0 % (ref 0.0–0.2)

## 2023-07-16 DIAGNOSIS — F411 Generalized anxiety disorder: Secondary | ICD-10-CM | POA: Diagnosis not present

## 2023-07-16 DIAGNOSIS — F3342 Major depressive disorder, recurrent, in full remission: Secondary | ICD-10-CM | POA: Diagnosis not present

## 2023-07-31 DIAGNOSIS — F411 Generalized anxiety disorder: Secondary | ICD-10-CM | POA: Diagnosis not present

## 2023-07-31 DIAGNOSIS — F3342 Major depressive disorder, recurrent, in full remission: Secondary | ICD-10-CM | POA: Diagnosis not present

## 2023-08-01 ENCOUNTER — Other Ambulatory Visit: Payer: Self-pay | Admitting: Hematology and Oncology

## 2023-08-01 DIAGNOSIS — D75839 Thrombocytosis, unspecified: Secondary | ICD-10-CM

## 2023-08-04 ENCOUNTER — Inpatient Hospital Stay: Attending: Hematology and Oncology

## 2023-08-04 ENCOUNTER — Telehealth: Payer: Self-pay | Admitting: *Deleted

## 2023-08-04 DIAGNOSIS — D473 Essential (hemorrhagic) thrombocythemia: Secondary | ICD-10-CM | POA: Diagnosis not present

## 2023-08-04 DIAGNOSIS — Z79899 Other long term (current) drug therapy: Secondary | ICD-10-CM | POA: Insufficient documentation

## 2023-08-04 DIAGNOSIS — D75838 Other thrombocytosis: Secondary | ICD-10-CM | POA: Diagnosis not present

## 2023-08-04 DIAGNOSIS — D75839 Thrombocytosis, unspecified: Secondary | ICD-10-CM

## 2023-08-04 LAB — CBC WITH DIFFERENTIAL (CANCER CENTER ONLY)
Abs Immature Granulocytes: 0.01 10*3/uL (ref 0.00–0.07)
Basophils Absolute: 0.1 10*3/uL (ref 0.0–0.1)
Basophils Relative: 1 %
Eosinophils Absolute: 0.2 10*3/uL (ref 0.0–0.5)
Eosinophils Relative: 3 %
HCT: 35.6 % — ABNORMAL LOW (ref 36.0–46.0)
Hemoglobin: 12.6 g/dL (ref 12.0–15.0)
Immature Granulocytes: 0 %
Lymphocytes Relative: 24 %
Lymphs Abs: 1.6 10*3/uL (ref 0.7–4.0)
MCH: 33.4 pg (ref 26.0–34.0)
MCHC: 35.4 g/dL (ref 30.0–36.0)
MCV: 94.4 fL (ref 80.0–100.0)
Monocytes Absolute: 0.5 10*3/uL (ref 0.1–1.0)
Monocytes Relative: 7 %
Neutro Abs: 4.3 10*3/uL (ref 1.7–7.7)
Neutrophils Relative %: 65 %
Platelet Count: 370 10*3/uL (ref 150–400)
RBC: 3.77 MIL/uL — ABNORMAL LOW (ref 3.87–5.11)
RDW: 17.9 % — ABNORMAL HIGH (ref 11.5–15.5)
WBC Count: 6.5 10*3/uL (ref 4.0–10.5)
nRBC: 0 % (ref 0.0–0.2)

## 2023-08-04 LAB — CMP (CANCER CENTER ONLY)
ALT: 10 U/L (ref 0–44)
AST: 14 U/L — ABNORMAL LOW (ref 15–41)
Albumin: 4.5 g/dL (ref 3.5–5.0)
Alkaline Phosphatase: 56 U/L (ref 38–126)
Anion gap: 7 (ref 5–15)
BUN: 22 mg/dL (ref 8–23)
CO2: 29 mmol/L (ref 22–32)
Calcium: 9.1 mg/dL (ref 8.9–10.3)
Chloride: 102 mmol/L (ref 98–111)
Creatinine: 1.12 mg/dL — ABNORMAL HIGH (ref 0.44–1.00)
GFR, Estimated: 50 mL/min — ABNORMAL LOW (ref >60)
Glucose, Bld: 102 mg/dL — ABNORMAL HIGH (ref 70–99)
Potassium: 3.6 mmol/L (ref 3.5–5.1)
Sodium: 138 mmol/L (ref 135–145)
Total Bilirubin: 0.5 mg/dL (ref 0.0–1.2)
Total Protein: 6.9 g/dL (ref 6.5–8.1)

## 2023-08-04 NOTE — Telephone Encounter (Signed)
-----   Message from Darilyn Edin sent at 08/04/2023 12:17 PM EDT ----- Plt count in range. Continue on current dose of hydrea  therapy. ----- Message ----- From: Dannis Dy, Lab In [place] Sent: 08/04/2023  10:47 AM EDT To: Ander Bame, MD

## 2023-08-04 NOTE — Telephone Encounter (Signed)
 TCT patient regarding recent lab results. No answer but was able to leave detailed message on her identified vm. Advised that her platelet count is in normal range @ 370. Advised to continue with her current dose of Hydrea  per Wyline Hearing, PA-C

## 2023-08-11 ENCOUNTER — Encounter: Payer: Self-pay | Admitting: Nurse Practitioner

## 2023-08-13 ENCOUNTER — Ambulatory Visit: Admitting: Nurse Practitioner

## 2023-08-13 ENCOUNTER — Encounter: Payer: Self-pay | Admitting: Nurse Practitioner

## 2023-08-13 VITALS — BP 112/62 | HR 80 | Ht 64.75 in | Wt 156.0 lb

## 2023-08-13 DIAGNOSIS — M8589 Other specified disorders of bone density and structure, multiple sites: Secondary | ICD-10-CM

## 2023-08-13 DIAGNOSIS — L900 Lichen sclerosus et atrophicus: Secondary | ICD-10-CM | POA: Diagnosis not present

## 2023-08-13 DIAGNOSIS — N952 Postmenopausal atrophic vaginitis: Secondary | ICD-10-CM | POA: Diagnosis not present

## 2023-08-13 DIAGNOSIS — Z9189 Other specified personal risk factors, not elsewhere classified: Secondary | ICD-10-CM

## 2023-08-13 MED ORDER — CLOBETASOL PROPIONATE 0.05 % EX CREA: 1.0000 | TOPICAL_CREAM | CUTANEOUS | 2 refills | Status: AC

## 2023-08-13 MED ORDER — ESTRADIOL 0.1 MG/GM VA CREA
1.0000 g | TOPICAL_CREAM | VAGINAL | 2 refills | Status: AC
Start: 2023-08-14 — End: ?

## 2023-08-13 NOTE — Progress Notes (Signed)
   Acute Office Visit  Subjective:    Patient ID: Kim Ferrell, female    DOB: 07/28/1945, 78 y.o.   MRN: [id number]   HPI 78 y.o. presents today for medication management. Hysterectomy many years go for fibroids. Using vaginal estrogen cream for atrophic vaginitis, not consistently due to not having enough applicators. Lichen sclerosis, using Clobetasol  as needed, not consistent. Does have burning sometimes. T-score -1.8, FRAX 20% / 4.7%.   Patient's last menstrual period was 04/02/1987.    Review of Systems  Constitutional: Negative.        Objective:     Physical Exam Constitutional:      Appearance: Normal appearance.     BP 112/62   Pulse 80   Ht 5' 4.75" (1.645 m)   Wt 156 lb (70.8 kg)   LMP 04/02/1987   SpO2 96%   BMI 26.16 kg/m  Wt Readings from Last 3 Encounters:  08/13/23 156 lb (70.8 kg)  06/12/23 158 lb (71.7 kg)  05/06/23 157 lb 7 oz (71.4 kg)        Assessment & Plan:   Problem List Items Addressed This Visit       Other   Lichen sclerosus - Primary   Relevant Medications   clobetasol  cream (TEMOVATE ) 0.05 % (Start on 08/14/2023)   Other Visit Diagnoses       Vaginal atrophy         Osteopenia of multiple sites         Fracture Risk Assessment Score (FRAX) indicating greater than 3% risk for hip fracture         Post-menopausal atrophic vaginitis       Relevant Medications   estradiol  (ESTRACE  VAGINAL) 0.1 MG/GM vaginal cream (Start on 08/14/2023)      Plan: Recommend consistent use with vaginal estrogen and clobetasol  for best results. Can get applicators on [employer] if pharmacy will no provide extra. Discussed DXA and increased risk for hip fracture. Recommend Vit D + Calcium and regular exercise.   Return in about 1 year (around 08/12/2024) for B&P.    Andee Bamberger DNP, 2:23 PM 08/13/2023

## 2023-08-18 DIAGNOSIS — F3342 Major depressive disorder, recurrent, in full remission: Secondary | ICD-10-CM | POA: Diagnosis not present

## 2023-08-18 DIAGNOSIS — F411 Generalized anxiety disorder: Secondary | ICD-10-CM | POA: Diagnosis not present

## 2023-08-26 DIAGNOSIS — F411 Generalized anxiety disorder: Secondary | ICD-10-CM | POA: Diagnosis not present

## 2023-08-26 DIAGNOSIS — F3342 Major depressive disorder, recurrent, in full remission: Secondary | ICD-10-CM | POA: Diagnosis not present

## 2023-09-02 ENCOUNTER — Other Ambulatory Visit: Payer: Self-pay | Admitting: Hematology and Oncology

## 2023-09-02 DIAGNOSIS — D473 Essential (hemorrhagic) thrombocythemia: Secondary | ICD-10-CM

## 2023-09-02 DIAGNOSIS — D75839 Thrombocytosis, unspecified: Secondary | ICD-10-CM

## 2023-09-02 NOTE — Progress Notes (Signed)
 [facility] [phone]   [phone]  PROGRESS NOTE  Patient Care Team: Elester Grim, MD as PCP - General (Internal Medicine) Andee Bamberger, NP as Nurse Practitioner (Gynecology)  Hematological/Oncological History # Essential Thrombocytosis, JAK2 Positive  02/07/2023: White blood cell 18.4, hemoglobin 13.5, MCV 89.2, platelets 571 04/07/2023: Establish care with Dr. Rosaline Coma  Interval History:  Kim Ferrell 78 y.o. female with medical history significant for essential thrombocytosis, JAK2 positive who presents for a follow up visit. The patient's last visit was on 06/12/2023. In the interim since the last visit she has had no major changes in her health.    On exam today Kim Ferrell reports that she faithfully takes her hydroxyurea  and aspirin  81 mg p.o. daily with no difficulty.  She is not having any bleeding, bruising, or dark stools.  She reports that her energy levels are good and currently ranks them as 8 out of 10.  She is not having any nausea, vomiting, or diarrhea.  She reports her appetite is okay, not always strong.  She reports she does not have any ulcers in the mouth or on the ankles.  She does have some occasional lightheadedness but no dizziness or shortness of breath.  She does lose her balance on occasion but has had no serious falls.  She reports that the price of her medications is fine and is not a problem.  She does have some dry skin, particular on her legs but reports that she is applying lotion to it.  She otherwise denies any fevers, chills, sweats, nausea, vomiting or diarrhea.  A full 10 point ROS is otherwise negative.  MEDICAL HISTORY:  Past Medical History:  Diagnosis Date   Anemia    hx   Arthritis    hands, knees   Asthma    has not needed any med. in 2 yrs.   Balance problem    Chronic bronchitis (HCC)    Dental crowns present    Depression    GERD (gastroesophageal reflux disease)    Hypertension    under  control, has been on med. x 3-4 yrs.   Lichen sclerosus    Migraines    Osteopenia 10/2017   T score -1.8 FRAX 12% / 2.3% excluding history of lower leg fracture falling downstairs.   Pneumonia    hx   Psoriasis    Seasonal allergies    Trimalleolar fracture of right ankle 09/16/2011    SURGICAL HISTORY: Past Surgical History:  Procedure Laterality Date   ABDOMINAL HYSTERECTOMY     partial   ANTERIOR CERVICAL DECOMP/DISCECTOMY FUSION N/A 11/02/2012   Procedure: ANTERIOR CERVICAL DECOMPRESSION/DISCECTOMY FUSION  cervical five-six cervical six-seven;  Surgeon: Elna Haggis, MD;  Location: [facility];  Service: Neurosurgery;  Laterality: N/A;  Cervical five six  Cervical six-seven Anterior cervical decompression/diskectomy/fusion   cataract surgery Bilateral 05/2022   The other 05/2022   HAND SURGERY     rt strep infection   KNEE SURGERY     ORIF ANKLE FRACTURE  09/17/2011   Procedure: OPEN REDUCTION INTERNAL FIXATION (ORIF) ANKLE FRACTURE;  Surgeon: Derald Flattery, MD;  Location: [facility];  Service: Orthopedics;  Laterality: Right;   TONSILLECTOMY      SOCIAL HISTORY: Social History   Socioeconomic History   Marital status: Married    Spouse name: Not on file   Number of children: Not on file   Years of education: Not on file   Highest education level:  Not on file  Occupational History   Not on file  Tobacco Use   Smoking status: Former    Current packs/day: 0.00    Average packs/day: 1 pack/day for 5.0 years (5.0 ttl pk-yrs)    Types: Cigarettes    Start date: 10/27/1969    Quit date: 10/28/1974    Years since quitting: 48.8   Smokeless tobacco: Never   Tobacco comments:    quit smoking 36 yrs. ago  Vaping Use   Vaping status: Never Used  Substance and Sexual Activity   Alcohol use: Yes    Comment: 3 glasses of wine daily   Drug use: No   Sexual activity: Not Currently    Birth control/protection: Surgical    Comment: Hyst; First IC  >16 y/o, <5 Partners  Other Topics Concern   Not on file  Social History Narrative   Not on file   Social Drivers of Health   Financial Resource Strain: Not on file  Food Insecurity: Not on file  Transportation Needs: Not on file  Physical Activity: Not on file  Stress: Not on file  Social Connections: Not on file  Intimate Partner Violence: Not on file    FAMILY HISTORY: Family History  Problem Relation Age of Onset   Diabetes Mother    Heart disease Mother    Cancer Father        skin   Heart disease Father    Stroke Sister    Heart disease Brother    Cancer Brother        Colon,Liver,Prostate    ALLERGIES:  is allergic to codeine, adhesive [tape], gold-containing drug products, neomycin-bacitracin  zn-polymyx, nickel, and silver.  MEDICATIONS:  Current Outpatient Medications  Medication Sig Dispense Refill   Adalimumab (HUMIRA [place]) Inject into the skin.     albuterol  (PROVENTIL  HFA;VENTOLIN  HFA) 108 (90 BASE) MCG/ACT inhaler Inhale 2 puffs into the lungs every 6 (six) hours as needed for shortness of breath.     ALPRAZolam (XANAX) 0.25 MG tablet      aspirin  EC 81 MG tablet Take 1 tablet (81 mg total) by mouth daily. Swallow whole. 30 tablet 0   aspirin -acetaminophen -caffeine (EXCEDRIN MIGRAINE) 250-250-65 MG tablet Take 1 tablet by mouth every 6 (six) hours as needed for headache.     buPROPion  (WELLBUTRIN  SR) 100 MG 12 hr tablet Take 100 mg by mouth daily.     cetirizine (ZYRTEC) 10 MG tablet Take 10 mg by mouth daily.     clobetasol  cream (TEMOVATE ) 0.05 % Apply 1 Application topically 2 (two) times a week. 60 g 2   estradiol  (ESTRACE  VAGINAL) 0.1 MG/GM vaginal cream Place 1 g vaginally 2 (two) times a week. 42.5 g 2   fluticasone  (FLONASE ) 50 MCG/ACT nasal spray Place 2 sprays into the nose daily as needed for rhinitis.     halobetasol (ULTRAVATE) 0.05 % cream Apply 1 application topically daily.      hydrocortisone  (ANUSOL -HC) 2.5 % rectal cream Place 1  application rectally 2 (two) times daily. 30 g 0   hydroxyurea  (HYDREA ) 500 MG capsule Take 1 capsule (500 mg total) by mouth daily. May take with food to minimize GI side effects. 90 capsule 1   LINZESS 290 MCG CAPS capsule Take 290 mcg by mouth daily.     losartan -hydrochlorothiazide  (HYZAAR) 100-25 MG per tablet Take 1 tablet by mouth daily.     omeprazole (PRILOSEC) 20 MG capsule Take 20 mg by mouth 2 (two) times daily.  sertraline (ZOLOFT) 100 MG tablet Take 150 mg by mouth daily.     SUMAtriptan  (IMITREX ) 100 MG tablet Take 100 mg by mouth every 2 (two) hours as needed for migraine. For headache     No current facility-administered medications for this visit.    REVIEW OF SYSTEMS:   Constitutional: ( - ) fevers, ( - )  chills , ( - ) night sweats Eyes: ( - ) blurriness of vision, ( - ) double vision, ( - ) watery eyes Ears, nose, mouth, throat, and face: ( - ) mucositis, ( - ) sore throat Respiratory: ( - ) cough, ( - ) dyspnea, ( - ) wheezes Cardiovascular: ( - ) palpitation, ( - ) chest discomfort, ( - ) lower extremity swelling Gastrointestinal:  ( - ) nausea, ( - ) heartburn, ( - ) change in bowel habits Skin: ( - ) abnormal skin rashes Lymphatics: ( - ) new lymphadenopathy, ( - ) easy bruising Neurological: ( - ) numbness, ( - ) tingling, ( - ) new weaknesses Behavioral/Psych: ( - ) mood change, ( - ) new changes  All other systems were reviewed with the patient and are negative.  PHYSICAL EXAMINATION:  Vitals:   09/03/23 1000  BP: (!) 149/81  Pulse: 73  Resp: 17  Temp: 97.9 F (36.6 C)  SpO2: 97%    Filed Weights   09/03/23 1000  Weight: 157 lb 12.8 oz (71.6 kg)     GENERAL: Well-appearing elderly Caucasian female, alert, no distress and comfortable SKIN: skin color, texture, turgor are normal, no rashes or significant lesions EYES: conjunctiva are pink and non-injected, sclera clear LUNGS: clear to auscultation and percussion with normal breathing  effort HEART: regular rate & rhythm and no murmurs and no lower extremity edema Musculoskeletal: no cyanosis of digits and no clubbing  PSYCH: alert & oriented x 3, fluent speech NEURO: no focal motor/sensory deficits  LABORATORY DATA:  I have reviewed the data as listed    Latest Ref Rng & Units 09/03/2023    9:49 AM 08/04/2023   10:34 AM 07/10/2023   10:10 AM  CBC  WBC 4.0 - 10.5 K/uL 6.5  6.5  6.0   Hemoglobin 12.0 - 15.0 g/dL 16.1  09.6  04.5   Hematocrit 36.0 - 46.0 % 34.7  35.6  37.2   Platelets 150 - 400 K/uL 327  370  421        Latest Ref Rng & Units 09/03/2023    9:49 AM 08/04/2023   10:34 AM 07/10/2023   10:10 AM  CMP  Glucose 70 - 99 mg/dL 409  811  914   BUN 8 - 23 mg/dL 22  22  19    Creatinine 0.44 - 1.00 mg/dL 7.82  9.56  2.13   Sodium 135 - 145 mmol/L 134  138  133   Potassium 3.5 - 5.1 mmol/L 4.1  3.6  3.7   Chloride 98 - 111 mmol/L 97  102  96   CO2 22 - 32 mmol/L 32  29  30   Calcium 8.9 - 10.3 mg/dL 9.2  9.1  9.2   Total Protein 6.5 - 8.1 g/dL 6.9  6.9  7.1   Total Bilirubin 0.0 - 1.2 mg/dL 0.5  0.5  0.4   Alkaline Phos 38 - 126 U/L 52  56  53   AST 15 - 41 U/L 14  14  14    ALT 0 - 44 U/L 10  10  12  RADIOGRAPHIC STUDIES: No results found.  ASSESSMENT & PLAN Saia Derossett 78 y.o. female with medical history significant for essential thrombocytosis, JAK2 positive who presents for a follow up visit.   # Essential Thrombocytosis, JAK2 positive -- Diagnosis confirmed with bone marrow biopsy.  Patient found to have JAK2 mutation on NGS panel -- Patient currently taking aspirin  81 mg p.o. daily in addition to hydroxyurea  500 mg p.o. daily -- Patient is tolerating treatment well with no major side effects. -- Labs today show white blood cell count 6.5, Hgb 12.3, MCV 98, Plt 327  -- RTC in 3 months for continued monitoring on hydroxyurea  therapy.  No orders of the defined types were placed in this encounter.   All questions were answered. The patient knows  to call the clinic with any problems, questions or concerns.  A total of more than 30 minutes were spent on this encounter with face-to-face time and non-face-to-face time, including preparing to see the patient, ordering tests and/or medications, counseling the patient and coordination of care as outlined above.   Rogerio Clay, MD Department of Hematology/Oncology [facility] Phone: [phone] Pager: [phone] Email: [email or web address]  09/07/2023 1:37 PM

## 2023-09-03 ENCOUNTER — Inpatient Hospital Stay: Attending: Hematology and Oncology

## 2023-09-03 ENCOUNTER — Inpatient Hospital Stay: Admitting: Hematology and Oncology

## 2023-09-03 VITALS — BP 149/81 | HR 73 | Temp 97.9°F | Resp 17 | Wt 157.8 lb

## 2023-09-03 DIAGNOSIS — Z885 Allergy status to narcotic agent status: Secondary | ICD-10-CM | POA: Diagnosis not present

## 2023-09-03 DIAGNOSIS — Z823 Family history of stroke: Secondary | ICD-10-CM | POA: Insufficient documentation

## 2023-09-03 DIAGNOSIS — M8580 Other specified disorders of bone density and structure, unspecified site: Secondary | ICD-10-CM | POA: Diagnosis not present

## 2023-09-03 DIAGNOSIS — Z833 Family history of diabetes mellitus: Secondary | ICD-10-CM | POA: Insufficient documentation

## 2023-09-03 DIAGNOSIS — Z8249 Family history of ischemic heart disease and other diseases of the circulatory system: Secondary | ICD-10-CM | POA: Diagnosis not present

## 2023-09-03 DIAGNOSIS — D75839 Thrombocytosis, unspecified: Secondary | ICD-10-CM

## 2023-09-03 DIAGNOSIS — D75838 Other thrombocytosis: Secondary | ICD-10-CM | POA: Insufficient documentation

## 2023-09-03 DIAGNOSIS — Z79899 Other long term (current) drug therapy: Secondary | ICD-10-CM | POA: Insufficient documentation

## 2023-09-03 DIAGNOSIS — Z7982 Long term (current) use of aspirin: Secondary | ICD-10-CM | POA: Diagnosis not present

## 2023-09-03 DIAGNOSIS — D473 Essential (hemorrhagic) thrombocythemia: Secondary | ICD-10-CM

## 2023-09-03 DIAGNOSIS — R42 Dizziness and giddiness: Secondary | ICD-10-CM | POA: Insufficient documentation

## 2023-09-03 DIAGNOSIS — Z808 Family history of malignant neoplasm of other organs or systems: Secondary | ICD-10-CM | POA: Insufficient documentation

## 2023-09-03 DIAGNOSIS — Z90710 Acquired absence of both cervix and uterus: Secondary | ICD-10-CM | POA: Insufficient documentation

## 2023-09-03 DIAGNOSIS — Z8042 Family history of malignant neoplasm of prostate: Secondary | ICD-10-CM | POA: Diagnosis not present

## 2023-09-03 DIAGNOSIS — Z800 Family history of malignant neoplasm of digestive organs: Secondary | ICD-10-CM | POA: Diagnosis not present

## 2023-09-03 DIAGNOSIS — Z87891 Personal history of nicotine dependence: Secondary | ICD-10-CM | POA: Insufficient documentation

## 2023-09-03 LAB — CBC WITH DIFFERENTIAL (CANCER CENTER ONLY)
Abs Immature Granulocytes: 0.02 10*3/uL (ref 0.00–0.07)
Basophils Absolute: 0.1 10*3/uL (ref 0.0–0.1)
Basophils Relative: 1 %
Eosinophils Absolute: 0.2 10*3/uL (ref 0.0–0.5)
Eosinophils Relative: 2 %
HCT: 34.7 % — ABNORMAL LOW (ref 36.0–46.0)
Hemoglobin: 12.3 g/dL (ref 12.0–15.0)
Immature Granulocytes: 0 %
Lymphocytes Relative: 24 %
Lymphs Abs: 1.6 10*3/uL (ref 0.7–4.0)
MCH: 34.7 pg — ABNORMAL HIGH (ref 26.0–34.0)
MCHC: 35.4 g/dL (ref 30.0–36.0)
MCV: 98 fL (ref 80.0–100.0)
Monocytes Absolute: 0.4 10*3/uL (ref 0.1–1.0)
Monocytes Relative: 6 %
Neutro Abs: 4.3 10*3/uL (ref 1.7–7.7)
Neutrophils Relative %: 67 %
Platelet Count: 327 10*3/uL (ref 150–400)
RBC: 3.54 MIL/uL — ABNORMAL LOW (ref 3.87–5.11)
RDW: 15.8 % — ABNORMAL HIGH (ref 11.5–15.5)
WBC Count: 6.5 10*3/uL (ref 4.0–10.5)
nRBC: 0 % (ref 0.0–0.2)

## 2023-09-03 LAB — CMP (CANCER CENTER ONLY)
ALT: 10 U/L (ref 0–44)
AST: 14 U/L — ABNORMAL LOW (ref 15–41)
Albumin: 4.3 g/dL (ref 3.5–5.0)
Alkaline Phosphatase: 52 U/L (ref 38–126)
Anion gap: 5 (ref 5–15)
BUN: 22 mg/dL (ref 8–23)
CO2: 32 mmol/L (ref 22–32)
Calcium: 9.2 mg/dL (ref 8.9–10.3)
Chloride: 97 mmol/L — ABNORMAL LOW (ref 98–111)
Creatinine: 0.96 mg/dL (ref 0.44–1.00)
GFR, Estimated: >60 mL/min (ref >60)
Glucose, Bld: 105 mg/dL — ABNORMAL HIGH (ref 70–99)
Potassium: 4.1 mmol/L (ref 3.5–5.1)
Sodium: 134 mmol/L — ABNORMAL LOW (ref 135–145)
Total Bilirubin: 0.5 mg/dL (ref 0.0–1.2)
Total Protein: 6.9 g/dL (ref 6.5–8.1)

## 2023-09-12 DIAGNOSIS — F411 Generalized anxiety disorder: Secondary | ICD-10-CM | POA: Diagnosis not present

## 2023-09-12 DIAGNOSIS — F3342 Major depressive disorder, recurrent, in full remission: Secondary | ICD-10-CM | POA: Diagnosis not present

## 2023-09-17 DIAGNOSIS — K649 Unspecified hemorrhoids: Secondary | ICD-10-CM | POA: Diagnosis not present

## 2023-09-17 DIAGNOSIS — K5909 Other constipation: Secondary | ICD-10-CM | POA: Diagnosis not present

## 2023-09-17 DIAGNOSIS — R1030 Lower abdominal pain, unspecified: Secondary | ICD-10-CM | POA: Diagnosis not present

## 2023-10-13 DIAGNOSIS — L814 Other melanin hyperpigmentation: Secondary | ICD-10-CM | POA: Diagnosis not present

## 2023-10-13 DIAGNOSIS — L570 Actinic keratosis: Secondary | ICD-10-CM | POA: Diagnosis not present

## 2023-10-13 DIAGNOSIS — L821 Other seborrheic keratosis: Secondary | ICD-10-CM | POA: Diagnosis not present

## 2023-10-13 DIAGNOSIS — L853 Xerosis cutis: Secondary | ICD-10-CM | POA: Diagnosis not present

## 2023-10-21 DIAGNOSIS — K6289 Other specified diseases of anus and rectum: Secondary | ICD-10-CM | POA: Diagnosis not present

## 2023-10-27 DIAGNOSIS — F3342 Major depressive disorder, recurrent, in full remission: Secondary | ICD-10-CM | POA: Diagnosis not present

## 2023-10-27 DIAGNOSIS — F411 Generalized anxiety disorder: Secondary | ICD-10-CM | POA: Diagnosis not present

## 2023-11-06 DIAGNOSIS — F411 Generalized anxiety disorder: Secondary | ICD-10-CM | POA: Diagnosis not present

## 2023-11-06 DIAGNOSIS — F3342 Major depressive disorder, recurrent, in full remission: Secondary | ICD-10-CM | POA: Diagnosis not present

## 2023-11-17 DIAGNOSIS — K643 Fourth degree hemorrhoids: Secondary | ICD-10-CM | POA: Diagnosis not present

## 2023-11-20 DIAGNOSIS — F3342 Major depressive disorder, recurrent, in full remission: Secondary | ICD-10-CM | POA: Diagnosis not present

## 2023-11-20 DIAGNOSIS — F411 Generalized anxiety disorder: Secondary | ICD-10-CM | POA: Diagnosis not present

## 2023-11-25 ENCOUNTER — Encounter: Payer: Self-pay | Admitting: Internal Medicine

## 2023-11-25 ENCOUNTER — Other Ambulatory Visit: Payer: Self-pay | Admitting: Internal Medicine

## 2023-11-25 DIAGNOSIS — M542 Cervicalgia: Secondary | ICD-10-CM

## 2023-11-25 DIAGNOSIS — E7800 Pure hypercholesterolemia, unspecified: Secondary | ICD-10-CM | POA: Diagnosis not present

## 2023-11-25 DIAGNOSIS — D473 Essential (hemorrhagic) thrombocythemia: Secondary | ICD-10-CM | POA: Diagnosis not present

## 2023-11-25 DIAGNOSIS — J45909 Unspecified asthma, uncomplicated: Secondary | ICD-10-CM | POA: Diagnosis not present

## 2023-11-25 DIAGNOSIS — Z66 Do not resuscitate: Secondary | ICD-10-CM | POA: Diagnosis not present

## 2023-11-25 DIAGNOSIS — I10 Essential (primary) hypertension: Secondary | ICD-10-CM | POA: Diagnosis not present

## 2023-11-25 DIAGNOSIS — L4050 Arthropathic psoriasis, unspecified: Secondary | ICD-10-CM | POA: Diagnosis not present

## 2023-11-25 DIAGNOSIS — R7303 Prediabetes: Secondary | ICD-10-CM | POA: Diagnosis not present

## 2023-11-25 DIAGNOSIS — Z0000 Encounter for general adult medical examination without abnormal findings: Secondary | ICD-10-CM | POA: Diagnosis not present

## 2023-11-25 DIAGNOSIS — F329 Major depressive disorder, single episode, unspecified: Secondary | ICD-10-CM | POA: Diagnosis not present

## 2023-11-26 ENCOUNTER — Encounter: Payer: Self-pay | Admitting: Internal Medicine

## 2023-11-26 DIAGNOSIS — R7303 Prediabetes: Secondary | ICD-10-CM | POA: Diagnosis not present

## 2023-11-26 DIAGNOSIS — E7800 Pure hypercholesterolemia, unspecified: Secondary | ICD-10-CM | POA: Diagnosis not present

## 2023-11-26 DIAGNOSIS — I10 Essential (primary) hypertension: Secondary | ICD-10-CM | POA: Diagnosis not present

## 2023-12-02 ENCOUNTER — Other Ambulatory Visit: Payer: Self-pay | Admitting: Internal Medicine

## 2023-12-02 ENCOUNTER — Ambulatory Visit
Admission: RE | Admit: 2023-12-02 | Discharge: 2023-12-02 | Disposition: A | Discharge status: Home / Self Care | Source: Ambulatory Visit | Attending: Internal Medicine | Admitting: Internal Medicine

## 2023-12-02 DIAGNOSIS — M542 Cervicalgia: Secondary | ICD-10-CM

## 2023-12-02 DIAGNOSIS — M47812 Spondylosis without myelopathy or radiculopathy, cervical region: Secondary | ICD-10-CM | POA: Diagnosis not present

## 2023-12-02 DIAGNOSIS — M4802 Spinal stenosis, cervical region: Secondary | ICD-10-CM | POA: Diagnosis not present

## 2023-12-03 DIAGNOSIS — F3342 Major depressive disorder, recurrent, in full remission: Secondary | ICD-10-CM | POA: Diagnosis not present

## 2023-12-04 ENCOUNTER — Inpatient Hospital Stay: Admitting: Hematology and Oncology

## 2023-12-04 ENCOUNTER — Inpatient Hospital Stay: Attending: Hematology and Oncology

## 2023-12-04 DIAGNOSIS — Z79899 Other long term (current) drug therapy: Secondary | ICD-10-CM | POA: Insufficient documentation

## 2023-12-04 DIAGNOSIS — Z87891 Personal history of nicotine dependence: Secondary | ICD-10-CM | POA: Insufficient documentation

## 2023-12-04 DIAGNOSIS — D75838 Other thrombocytosis: Secondary | ICD-10-CM | POA: Insufficient documentation

## 2023-12-04 DIAGNOSIS — D473 Essential (hemorrhagic) thrombocythemia: Secondary | ICD-10-CM | POA: Insufficient documentation

## 2023-12-04 DIAGNOSIS — Z7982 Long term (current) use of aspirin: Secondary | ICD-10-CM | POA: Insufficient documentation

## 2023-12-04 NOTE — Progress Notes (Signed)
 Rescheduled

## 2023-12-05 ENCOUNTER — Telehealth: Payer: Self-pay | Admitting: Hematology and Oncology

## 2023-12-16 ENCOUNTER — Other Ambulatory Visit: Payer: Self-pay | Admitting: Physician Assistant

## 2023-12-16 DIAGNOSIS — D473 Essential (hemorrhagic) thrombocythemia: Secondary | ICD-10-CM

## 2023-12-16 DIAGNOSIS — F332 Major depressive disorder, recurrent severe without psychotic features: Secondary | ICD-10-CM | POA: Diagnosis not present

## 2023-12-17 ENCOUNTER — Inpatient Hospital Stay: Admitting: Physician Assistant

## 2023-12-17 ENCOUNTER — Inpatient Hospital Stay

## 2023-12-17 DIAGNOSIS — D473 Essential (hemorrhagic) thrombocythemia: Secondary | ICD-10-CM

## 2023-12-17 DIAGNOSIS — Z79899 Other long term (current) drug therapy: Secondary | ICD-10-CM | POA: Diagnosis not present

## 2023-12-17 DIAGNOSIS — D75838 Other thrombocytosis: Secondary | ICD-10-CM | POA: Diagnosis not present

## 2023-12-17 DIAGNOSIS — Z87891 Personal history of nicotine dependence: Secondary | ICD-10-CM | POA: Diagnosis not present

## 2023-12-17 DIAGNOSIS — Z7982 Long term (current) use of aspirin: Secondary | ICD-10-CM | POA: Diagnosis not present

## 2023-12-17 LAB — CMP (CANCER CENTER ONLY)
ALT: 12 U/L (ref 0–44)
AST: 14 U/L — ABNORMAL LOW (ref 15–41)
Albumin: 4.3 g/dL (ref 3.5–5.0)
Alkaline Phosphatase: 64 U/L (ref 38–126)
Anion gap: 5 (ref 5–15)
BUN: 15 mg/dL (ref 8–23)
CO2: 31 mmol/L (ref 22–32)
Calcium: 9.2 mg/dL (ref 8.9–10.3)
Chloride: 95 mmol/L — ABNORMAL LOW (ref 98–111)
Creatinine: 0.98 mg/dL (ref 0.44–1.00)
GFR, Estimated: 59 mL/min — ABNORMAL LOW (ref >60)
Glucose, Bld: 102 mg/dL — ABNORMAL HIGH (ref 70–99)
Potassium: 3.9 mmol/L (ref 3.5–5.1)
Sodium: 131 mmol/L — ABNORMAL LOW (ref 135–145)
Total Bilirubin: 0.4 mg/dL (ref 0.0–1.2)
Total Protein: 6.9 g/dL (ref 6.5–8.1)

## 2023-12-17 LAB — CBC WITH DIFFERENTIAL (CANCER CENTER ONLY)
Abs Immature Granulocytes: 0.02 K/uL (ref 0.00–0.07)
Basophils Absolute: 0.1 K/uL (ref 0.0–0.1)
Basophils Relative: 1 %
Eosinophils Absolute: 0.2 K/uL (ref 0.0–0.5)
Eosinophils Relative: 3 %
HCT: 35.3 % — ABNORMAL LOW (ref 36.0–46.0)
Hemoglobin: 12.6 g/dL (ref 12.0–15.0)
Immature Granulocytes: 0 %
Lymphocytes Relative: 26 %
Lymphs Abs: 1.2 K/uL (ref 0.7–4.0)
MCH: 36.3 pg — ABNORMAL HIGH (ref 26.0–34.0)
MCHC: 35.7 g/dL (ref 30.0–36.0)
MCV: 101.7 fL — ABNORMAL HIGH (ref 80.0–100.0)
Monocytes Absolute: 0.4 K/uL (ref 0.1–1.0)
Monocytes Relative: 9 %
Neutro Abs: 2.8 K/uL (ref 1.7–7.7)
Neutrophils Relative %: 61 %
Platelet Count: 299 K/uL (ref 150–400)
RBC: 3.47 MIL/uL — ABNORMAL LOW (ref 3.87–5.11)
RDW: 13.3 % (ref 11.5–15.5)
WBC Count: 4.7 K/uL (ref 4.0–10.5)
nRBC: 0 % (ref 0.0–0.2)

## 2023-12-17 MED ORDER — HYDROXYUREA 500 MG PO CAPS: 500.0000 mg | ORAL_CAPSULE | Freq: Every day | ORAL | 1 refills | Status: AC

## 2023-12-17 NOTE — Progress Notes (Signed)
 [facility] Telephone:(336) [phone]   [phone]  PROGRESS NOTE  Patient Care Team: [name], MD as PCP - General (Internal Medicine) [name], NP as Nurse Practitioner (Gynecology)  Hematological/Oncological History # Essential Thrombocytosis, JAK2 Positive  02/07/2023: White blood cell 18.4, hemoglobin 13.5, MCV 89.2, platelets 571 04/07/2023: Establish care with Dr. Federico  Interval History:  Kim Ferrell 78 y.o. female with medical history significant for essential thrombocytosis, JAK2 positive who presents for a follow up visit. The patient's last visit was on 09/03/2023. In the interim since the last visit she has had no major changes in her health.    On exam today Kim Ferrell reports that she is doing well without any new or concerning symptoms.  Her energy and appetite are stable.  She can complete all her daily activities on her own.  She denies nausea, vomiting or bowel habit changes.  She denies easy bruising or signs of active bleeding.  She has noticed brown some spots on her extremities with hydroxyurea .  Her dermatologist did prescribe an topical ointment for the affected areas.  She denies fevers, chills, night sweats, shortness of breath, chest pain or cough.  She has no other complaints.  A full 10 point ROS is otherwise negative.  MEDICAL HISTORY:  Past Medical History:  Diagnosis Date   Anemia    hx   Arthritis    hands, knees   Asthma    has not needed any med. in 2 yrs.   Balance problem    Chronic bronchitis (HCC)    Dental crowns present    Depression    GERD (gastroesophageal reflux disease)    Hypertension    under control, has been on med. x 3-4 yrs.   Lichen sclerosus    Migraines    Osteopenia 10/2017   T score -1.8 FRAX 12% / 2.3% excluding history of lower leg fracture falling downstairs.   Pneumonia    hx   Psoriasis    Seasonal allergies    Trimalleolar fracture of right ankle 09/16/2011    SURGICAL  HISTORY: Past Surgical History:  Procedure Laterality Date   ABDOMINAL HYSTERECTOMY     partial   ANTERIOR CERVICAL DECOMP/DISCECTOMY FUSION N/A 11/02/2012   Procedure: ANTERIOR CERVICAL DECOMPRESSION/DISCECTOMY FUSION  cervical five-six cervical six-seven;  Surgeon: Victory Gens, MD;  Location: [facility];  Service: Neurosurgery;  Laterality: N/A;  Cervical five six  Cervical six-seven Anterior cervical decompression/diskectomy/fusion   cataract surgery Bilateral 05/2022   The other 05/2022   HAND SURGERY     rt strep infection   KNEE SURGERY     ORIF ANKLE FRACTURE  09/17/2011   Procedure: OPEN REDUCTION INTERNAL FIXATION (ORIF) ANKLE FRACTURE;  Surgeon: Eva Elsie Herring, MD;  Location: [facility];  Service: Orthopedics;  Laterality: Right;   TONSILLECTOMY      SOCIAL HISTORY: Social History   Socioeconomic History   Marital status: Married    Spouse name: Not on file   Number of children: Not on file   Years of education: Not on file   Highest education level: Not on file  Occupational History   Not on file  Tobacco Use   Smoking status: Former    Current packs/day: 0.00    Average packs/day: 1 pack/day for 5.0 years (5.0 ttl pk-yrs)    Types: Cigarettes    Start date: 10/27/1969    Quit date: 10/28/1974    Years since quitting: 21.1  Smokeless tobacco: Never   Tobacco comments:    quit smoking 36 yrs. ago  Vaping Use   Vaping status: Never Used  Substance and Sexual Activity   Alcohol use: Yes    Comment: 3 glasses of wine daily   Drug use: No   Sexual activity: Not Currently    Birth control/protection: Surgical    Comment: Hyst; First IC >16 y/o, <5 Partners  Other Topics Concern   Not on file  Social History Narrative   Not on file   Social Drivers of Health   Financial Resource Strain: Not on file  Food Insecurity: Not on file  Transportation Needs: Not on file  Physical Activity: Not on file  Stress: Not on file  Social  Connections: Not on file  Intimate Partner Violence: Not on file    FAMILY HISTORY: Family History  Problem Relation Age of Onset   Diabetes Mother    Heart disease Mother    Cancer Father        skin   Heart disease Father    Stroke Sister    Heart disease Brother    Cancer Brother        Colon,Liver,Prostate    ALLERGIES:  is allergic to codeine, adhesive [tape], gold-containing drug products, neomycin-bacitracin  zn-polymyx, nickel, and silver.  MEDICATIONS:  Current Outpatient Medications  Medication Sig Dispense Refill   Adalimumab (HUMIRA [place]) Inject into the skin.     albuterol  (PROVENTIL  HFA;VENTOLIN  HFA) 108 (90 BASE) MCG/ACT inhaler Inhale 2 puffs into the lungs every 6 (six) hours as needed for shortness of breath.     ALPRAZolam (XANAX) 0.25 MG tablet      aspirin  EC 81 MG tablet Take 1 tablet (81 mg total) by mouth daily. Swallow whole. 30 tablet 0   aspirin -acetaminophen -caffeine (EXCEDRIN MIGRAINE) 250-250-65 MG tablet Take 1 tablet by mouth every 6 (six) hours as needed for headache.     buPROPion  (WELLBUTRIN  SR) 100 MG 12 hr tablet Take 100 mg by mouth daily.     cetirizine (ZYRTEC) 10 MG tablet Take 10 mg by mouth daily.     clobetasol  cream (TEMOVATE ) 0.05 % Apply 1 Application topically 2 (two) times a week. 60 g 2   estradiol  (ESTRACE  VAGINAL) 0.1 MG/GM vaginal cream Place 1 g vaginally 2 (two) times a week. 42.5 g 2   fluticasone  (FLONASE ) 50 MCG/ACT nasal spray Place 2 sprays into the nose daily as needed for rhinitis.     halobetasol (ULTRAVATE) 0.05 % cream Apply 1 application topically daily.      hydrocortisone  (ANUSOL -HC) 2.5 % rectal cream Place 1 application rectally 2 (two) times daily. 30 g 0   LINZESS 290 MCG CAPS capsule Take 290 mcg by mouth daily.     losartan -hydrochlorothiazide  (HYZAAR) 100-25 MG per tablet Take 1 tablet by mouth daily.     omeprazole (PRILOSEC) 20 MG capsule Take 20 mg by mouth 2 (two) times daily.     sertraline (ZOLOFT) 100  MG tablet Take 150 mg by mouth daily.     SUMAtriptan  (IMITREX ) 100 MG tablet Take 100 mg by mouth every 2 (two) hours as needed for migraine. For headache     hydroxyurea  (HYDREA ) 500 MG capsule Take 1 capsule (500 mg total) by mouth daily. May take with food to minimize GI side effects. 90 capsule 1   No current facility-administered medications for this visit.    REVIEW OF SYSTEMS:   Constitutional: ( - ) fevers, ( - )  chills , ( - ) night sweats Eyes: ( - ) blurriness of vision, ( - ) double vision, ( - ) watery eyes Ears, nose, mouth, throat, and face: ( - ) mucositis, ( - ) sore throat Respiratory: ( - ) cough, ( - ) dyspnea, ( - ) wheezes Cardiovascular: ( - ) palpitation, ( - ) chest discomfort, ( - ) lower extremity swelling Gastrointestinal:  ( - ) nausea, ( - ) heartburn, ( - ) change in bowel habits Skin: ( - ) abnormal skin rashes Lymphatics: ( - ) new lymphadenopathy, ( - ) easy bruising Neurological: ( - ) numbness, ( - ) tingling, ( - ) new weaknesses Behavioral/Psych: ( - ) mood change, ( - ) new changes  All other systems were reviewed with the patient and are negative.  PHYSICAL EXAMINATION:  Vitals:   12/17/23 0938  BP: (!) 140/86  Pulse: 78  Resp: 17  Temp: (!) 97.5 F (36.4 C)  SpO2: 98%    Filed Weights   12/17/23 0938  Weight: 160 lb (72.6 kg)     GENERAL: Well-appearing elderly Caucasian female, alert, no distress and comfortable SKIN: skin color, texture, turgor are normal, no rashes or significant lesions EYES: conjunctiva are pink and non-injected, sclera clear LUNGS: clear to auscultation and percussion with normal breathing effort HEART: regular rate & rhythm and no murmurs and no lower extremity edema Musculoskeletal: no cyanosis of digits and no clubbing  PSYCH: alert & oriented x 3, fluent speech NEURO: no focal motor/sensory deficits  LABORATORY DATA:  I have reviewed the data as listed    Latest Ref Rng & Units 12/17/2023    9:25  AM 09/03/2023    9:49 AM 08/04/2023   10:34 AM  CBC  WBC 4.0 - 10.5 K/uL 4.7  6.5  6.5   Hemoglobin 12.0 - 15.0 g/dL 87.3  87.6  87.3   Hematocrit 36.0 - 46.0 % 35.3  34.7  35.6   Platelets 150 - 400 K/uL 299  327  370        Latest Ref Rng & Units 12/17/2023    9:25 AM 09/03/2023    9:49 AM 08/04/2023   10:34 AM  CMP  Glucose 70 - 99 mg/dL 897  894  897   BUN 8 - 23 mg/dL 15  22  22    Creatinine 0.44 - 1.00 mg/dL 9.01  9.03  8.87   Sodium 135 - 145 mmol/L 131  134  138   Potassium 3.5 - 5.1 mmol/L 3.9  4.1  3.6   Chloride 98 - 111 mmol/L 95  97  102   CO2 22 - 32 mmol/L 31  32  29   Calcium 8.9 - 10.3 mg/dL 9.2  9.2  9.1   Total Protein 6.5 - 8.1 g/dL 6.9  6.9  6.9   Total Bilirubin 0.0 - 1.2 mg/dL 0.4  0.5  0.5   Alkaline Phos 38 - 126 U/L 64  52  56   AST 15 - 41 U/L 14  14  14    ALT 0 - 44 U/L 12  10  10      RADIOGRAPHIC STUDIES: CT CERVICAL SPINE WO CONTRAST Result Date: 12/11/2023 CLINICAL DATA:  78 year old female with left side neck pain radiating to the shoulder. Prior surgery. EXAM: CT CERVICAL SPINE WITHOUT CONTRAST TECHNIQUE: Multidetector CT imaging of the cervical spine was performed without intravenous contrast. Multiplanar CT image reconstructions were also generated. RADIATION DOSE REDUCTION: This exam was  performed according to the departmental dose-optimization program which includes automated exposure control, adjustment of the mA and/or kV according to patient size and/or use of iterative reconstruction technique. COMPARISON:  Report of cervical spine MRI 05/09/2012 (no images available). FINDINGS: Alignment: Straightening of cervical lordosis. Maintained cervicothoracic alignment. Bilateral posterior element alignment is within normal limits. Skull base and vertebrae: Background bone mineralization within normal limits for age. Visualized skull base is intact. No atlanto-occipital dissociation. C1 and C2 are chronically degenerated, but appear intact and aligned.  Postoperative details are below. No acute osseous abnormality identified. Soft tissues and spinal canal: No prevertebral fluid or swelling. No visible canal hematoma. Negative visible noncontrast neck aside from mild for age right carotid calcified atherosclerosis. Disc levels: C1-C2: Bulky degenerative osteophytosis, subchondral sclerosis, and evidence of degenerative subchondral cyst of the odontoid. C2-C3: Mild to moderate facet hypertrophy bilaterally. Ligament flavum hypertrophy is apparent. No definite stenosis. C3-C4: Moderate to severe facet arthropathy greater on the left. Left foraminal endplate spurring. Severe left C4 neural foraminal stenosis. Bulky ligament flavum hypertrophy also is apparent (series 9, image 40) and mild to moderate spinal stenosis is suspected at this level. C4-C5: Mild anterolisthesis. Disc space loss. Circumferential disc osteophyte complex. Moderate left and moderate to severe right facet hypertrophy. Mild to moderate ligament flavum hypertrophy. Mild spinal stenosis not excluded. Moderate to severe right C5 foraminal stenosis. C5-C6:  Prior ACDF with solid arthrodesis.  No hardware loosening. C6-C7: Prior ACDF with solid arthrodesis. However, the right side cortical screw is roughly 50% backed out anteriorly (series 10, image 67). C7-T1: Mild anterolisthesis. Endplate spurring. Moderate facet hypertrophy. Mild to moderate left C8 foraminal stenosis. Upper chest: Anterior disc and endplate degeneration at the cervicothoracic junction. Visible upper thoracic levels appear intact. Negative lung apices. Other: Cervicomedullary junction is within normal limits. Negative visible noncontrast brain parenchyma. Visualized paranasal sinuses and mastoids are clear. Previous feel IMPRESSION: 1. Prior ACDF at C5-C6 and C6-C7 with solid arthrodesis, despite the right side C6 cortical screw roughly 50% backed out anteriorly. 2. Advanced cervical spine degeneration elsewhere, especially facet  arthropathy and ligament flavum hypertrophy at both C3-C4 and C4-C5. Spinal stenosis suspected at the former. Severe left C4 and moderate to severe right C5 neural foraminal stenosis. 3. Lesser adjacent segment disease at C7-T1 with mild to moderate left C8 foraminal stenosis. 4.  No acute osseous abnormality. Electronically Signed   By: VEAR Hurst M.D.   On: 12/11/2023 06:18    ASSESSMENT & PLAN Kim Ferrell is a 78 y.o. female with medical history significant for essential thrombocytosis, JAK2 positive who presents for a follow up visit.   # Essential Thrombocytosis, JAK2 positive -- Diagnosis confirmed with bone marrow biopsy.  Patient found to have JAK2 mutation on NGS panel -- Patient currently taking aspirin  81 mg p.o. daily in addition to hydroxyurea  500 mg p.o. daily -- Patient is tolerating treatment well with no major side effects. -- Labs today show white blood cell count 4.7, Hgb 12.6, MCV 101.7, Plt 299, creatinine and LFTs are in range.  --Continue with hydroxyurea  treatment without any dose modifications -- RTC in 3 months for labs/follow up.   No orders of the defined types were placed in this encounter.   All questions were answered. The patient knows to call the clinic with any problems, questions or concerns.   I have spent a total of 25 minutes minutes of face-to-face and non-face-to-face time, preparing to see the patient,  performing a medically appropriate examination, counseling and educating  the patient, ordering medications/tests/procedures, documenting clinical information in the electronic health record, independently interpreting results and communicating results to the patient, and care coordination.   Johnston Police PA-C Dept of Hematology and Oncology [facility] Phone: [phone]   12/17/2023 10:06 AM

## 2023-12-18 DIAGNOSIS — R1030 Lower abdominal pain, unspecified: Secondary | ICD-10-CM | POA: Diagnosis not present

## 2023-12-18 DIAGNOSIS — K649 Unspecified hemorrhoids: Secondary | ICD-10-CM | POA: Diagnosis not present

## 2023-12-18 DIAGNOSIS — K5909 Other constipation: Secondary | ICD-10-CM | POA: Diagnosis not present

## 2023-12-31 DIAGNOSIS — D224 Melanocytic nevi of scalp and neck: Secondary | ICD-10-CM | POA: Diagnosis not present

## 2023-12-31 DIAGNOSIS — L814 Other melanin hyperpigmentation: Secondary | ICD-10-CM | POA: Diagnosis not present

## 2023-12-31 DIAGNOSIS — L570 Actinic keratosis: Secondary | ICD-10-CM | POA: Diagnosis not present

## 2023-12-31 DIAGNOSIS — F332 Major depressive disorder, recurrent severe without psychotic features: Secondary | ICD-10-CM | POA: Diagnosis not present

## 2023-12-31 DIAGNOSIS — Z85828 Personal history of other malignant neoplasm of skin: Secondary | ICD-10-CM | POA: Diagnosis not present

## 2023-12-31 DIAGNOSIS — L400 Psoriasis vulgaris: Secondary | ICD-10-CM | POA: Diagnosis not present

## 2023-12-31 DIAGNOSIS — L821 Other seborrheic keratosis: Secondary | ICD-10-CM | POA: Diagnosis not present

## 2023-12-31 DIAGNOSIS — L720 Epidermal cyst: Secondary | ICD-10-CM | POA: Diagnosis not present

## 2023-12-31 DIAGNOSIS — D225 Melanocytic nevi of trunk: Secondary | ICD-10-CM | POA: Diagnosis not present

## 2023-12-31 DIAGNOSIS — D692 Other nonthrombocytopenic purpura: Secondary | ICD-10-CM | POA: Diagnosis not present

## 2024-01-29 DIAGNOSIS — F332 Major depressive disorder, recurrent severe without psychotic features: Secondary | ICD-10-CM | POA: Diagnosis not present

## 2024-02-23 DIAGNOSIS — F332 Major depressive disorder, recurrent severe without psychotic features: Secondary | ICD-10-CM | POA: Diagnosis not present

## 2024-03-01 DIAGNOSIS — M1711 Unilateral primary osteoarthritis, right knee: Secondary | ICD-10-CM | POA: Diagnosis not present

## 2024-03-03 ENCOUNTER — Inpatient Hospital Stay: Admitting: Hematology and Oncology

## 2024-03-03 ENCOUNTER — Inpatient Hospital Stay: Attending: Hematology and Oncology

## 2024-03-03 VITALS — BP 139/81 | HR 73 | Temp 97.6°F | Resp 14 | Wt 158.0 lb

## 2024-03-03 DIAGNOSIS — G589 Mononeuropathy, unspecified: Secondary | ICD-10-CM | POA: Diagnosis not present

## 2024-03-03 DIAGNOSIS — Z833 Family history of diabetes mellitus: Secondary | ICD-10-CM | POA: Diagnosis not present

## 2024-03-03 DIAGNOSIS — Z79899 Other long term (current) drug therapy: Secondary | ICD-10-CM | POA: Insufficient documentation

## 2024-03-03 DIAGNOSIS — Z87891 Personal history of nicotine dependence: Secondary | ICD-10-CM | POA: Diagnosis not present

## 2024-03-03 DIAGNOSIS — Z9841 Cataract extraction status, right eye: Secondary | ICD-10-CM | POA: Diagnosis not present

## 2024-03-03 DIAGNOSIS — D473 Essential (hemorrhagic) thrombocythemia: Secondary | ICD-10-CM | POA: Diagnosis not present

## 2024-03-03 DIAGNOSIS — I10 Essential (primary) hypertension: Secondary | ICD-10-CM | POA: Diagnosis not present

## 2024-03-03 DIAGNOSIS — D75838 Other thrombocytosis: Secondary | ICD-10-CM | POA: Diagnosis present

## 2024-03-03 DIAGNOSIS — Z885 Allergy status to narcotic agent status: Secondary | ICD-10-CM | POA: Insufficient documentation

## 2024-03-03 DIAGNOSIS — Z800 Family history of malignant neoplasm of digestive organs: Secondary | ICD-10-CM | POA: Insufficient documentation

## 2024-03-03 DIAGNOSIS — Z8042 Family history of malignant neoplasm of prostate: Secondary | ICD-10-CM | POA: Diagnosis not present

## 2024-03-03 DIAGNOSIS — Z823 Family history of stroke: Secondary | ICD-10-CM | POA: Diagnosis not present

## 2024-03-03 DIAGNOSIS — Z8701 Personal history of pneumonia (recurrent): Secondary | ICD-10-CM | POA: Diagnosis not present

## 2024-03-03 DIAGNOSIS — D75839 Thrombocytosis, unspecified: Secondary | ICD-10-CM

## 2024-03-03 DIAGNOSIS — Z7982 Long term (current) use of aspirin: Secondary | ICD-10-CM | POA: Insufficient documentation

## 2024-03-03 DIAGNOSIS — Z7964 Long term (current) use of myelosuppressive agent: Secondary | ICD-10-CM | POA: Insufficient documentation

## 2024-03-03 DIAGNOSIS — Z90710 Acquired absence of both cervix and uterus: Secondary | ICD-10-CM | POA: Insufficient documentation

## 2024-03-03 DIAGNOSIS — Z8249 Family history of ischemic heart disease and other diseases of the circulatory system: Secondary | ICD-10-CM | POA: Diagnosis not present

## 2024-03-03 DIAGNOSIS — S8010XA Contusion of unspecified lower leg, initial encounter: Secondary | ICD-10-CM | POA: Insufficient documentation

## 2024-03-03 DIAGNOSIS — Z9842 Cataract extraction status, left eye: Secondary | ICD-10-CM | POA: Insufficient documentation

## 2024-03-03 DIAGNOSIS — Z881 Allergy status to other antibiotic agents status: Secondary | ICD-10-CM | POA: Insufficient documentation

## 2024-03-03 LAB — CBC WITH DIFFERENTIAL (CANCER CENTER ONLY)
Abs Immature Granulocytes: 0.02 K/uL (ref 0.00–0.07)
Basophils Absolute: 0 K/uL (ref 0.0–0.1)
Basophils Relative: 1 %
Eosinophils Absolute: 0 K/uL (ref 0.0–0.5)
Eosinophils Relative: 1 %
HCT: 35.7 % — ABNORMAL LOW (ref 36.0–46.0)
Hemoglobin: 12.5 g/dL (ref 12.0–15.0)
Immature Granulocytes: 0 %
Lymphocytes Relative: 26 %
Lymphs Abs: 1.9 K/uL (ref 0.7–4.0)
MCH: 36.4 pg — ABNORMAL HIGH (ref 26.0–34.0)
MCHC: 35 g/dL (ref 30.0–36.0)
MCV: 104.1 fL — ABNORMAL HIGH (ref 80.0–100.0)
Monocytes Absolute: 0.5 K/uL (ref 0.1–1.0)
Monocytes Relative: 7 %
Neutro Abs: 4.7 K/uL (ref 1.7–7.7)
Neutrophils Relative %: 65 %
Platelet Count: 379 K/uL (ref 150–400)
RBC: 3.43 MIL/uL — ABNORMAL LOW (ref 3.87–5.11)
RDW: 14.6 % (ref 11.5–15.5)
WBC Count: 7.1 K/uL (ref 4.0–10.5)
nRBC: 0 % (ref 0.0–0.2)

## 2024-03-03 LAB — CMP (CANCER CENTER ONLY)
ALT: 12 U/L (ref 0–44)
AST: 16 U/L (ref 15–41)
Albumin: 4.6 g/dL (ref 3.5–5.0)
Alkaline Phosphatase: 62 U/L (ref 38–126)
Anion gap: 10 (ref 5–15)
BUN: 21 mg/dL (ref 8–23)
CO2: 29 mmol/L (ref 22–32)
Calcium: 9.4 mg/dL (ref 8.9–10.3)
Chloride: 96 mmol/L — ABNORMAL LOW (ref 98–111)
Creatinine: 1.02 mg/dL — ABNORMAL HIGH (ref 0.44–1.00)
GFR, Estimated: 56 mL/min — ABNORMAL LOW (ref >60)
Glucose, Bld: 89 mg/dL (ref 70–99)
Potassium: 3.6 mmol/L (ref 3.5–5.1)
Sodium: 134 mmol/L — ABNORMAL LOW (ref 135–145)
Total Bilirubin: 0.4 mg/dL (ref 0.0–1.2)
Total Protein: 7.1 g/dL (ref 6.5–8.1)

## 2024-03-03 NOTE — Progress Notes (Signed)
 [facility] [phone]   [phone]  PROGRESS NOTE  Patient Care Team: [name], MD as PCP - General (Internal Medicine) [name], NP as Nurse Practitioner (Gynecology)  Hematological/Oncological History # Essential Thrombocytosis, JAK2 Positive  02/07/2023: White blood cell 18.4, hemoglobin 13.5, MCV 89.2, platelets 571 04/07/2023: Establish care with Dr. Federico  Interval History:  Kim Ferrell 78 y.o. female with medical history significant for essential thrombocytosis, JAK2 positive who presents for a follow up visit. The patient's last visit was on 12/17/2023. In the interim since the last visit she has had no major changes in her health.    On exam today Mrs. Pellegrin reports she has been well overall in the room since her last visit with no major changes in her health.  She continues taking hydroxyurea  5 mg p.o. daily but is not currently taking aspirin .  She reports that she will pick it up and begin taking 81 mg p.o. aspirin  at our request.  She will going to see neurosurgery tomorrow for her neck pain which she thinks is due to arthritis or pinched nerve.  She reports that it was particular acting up when she was recently on her trip to [place].  She reports that she is tolerating the hydroxyurea  therapy well with no bleeding or dark stools.  She does have some occasional bruising on her arms and legs.  She has no ulcers in her mouth or on her ankles.  She reports her appetite is good and her energy is not bad.  Overall she feels well and is willing and able to proceed with treatment at this time.  A full 10 point ROS is otherwise negative.  MEDICAL HISTORY:  Past Medical History:  Diagnosis Date   Anemia    hx   Arthritis    hands, knees   Asthma    has not needed any med. in 2 yrs.   Balance problem    Chronic bronchitis (HCC)    Dental crowns present    Depression    GERD (gastroesophageal reflux disease)     Hypertension    under control, has been on med. x 3-4 yrs.   Lichen sclerosus    Migraines    Osteopenia 10/2017   T score -1.8 FRAX 12% / 2.3% excluding history of lower leg fracture falling downstairs.   Pneumonia    hx   Psoriasis    Seasonal allergies    Trimalleolar fracture of right ankle 09/16/2011    SURGICAL HISTORY: Past Surgical History:  Procedure Laterality Date   ABDOMINAL HYSTERECTOMY     partial   ANTERIOR CERVICAL DECOMP/DISCECTOMY FUSION N/A 11/02/2012   Procedure: ANTERIOR CERVICAL DECOMPRESSION/DISCECTOMY FUSION  cervical five-six cervical six-seven;  Surgeon: Victory Gens, MD;  Location: [facility];  Service: Neurosurgery;  Laterality: N/A;  Cervical five six  Cervical six-seven Anterior cervical decompression/diskectomy/fusion   cataract surgery Bilateral 05/2022   The other 05/2022   HAND SURGERY     rt strep infection   KNEE SURGERY     ORIF ANKLE FRACTURE  09/17/2011   Procedure: OPEN REDUCTION INTERNAL FIXATION (ORIF) ANKLE FRACTURE;  Surgeon: Eva Elsie Herring, MD;  Location: [facility];  Service: Orthopedics;  Laterality: Right;   TONSILLECTOMY      SOCIAL HISTORY: Social History   Socioeconomic History   Marital status: Married    Spouse name: Not on file   Number of children: Not on file   Years of  education: Not on file   Highest education level: Not on file  Occupational History   Not on file  Tobacco Use   Smoking status: Former    Current packs/day: 0.00    Average packs/day: 1 pack/day for 5.0 years (5.0 ttl pk-yrs)    Types: Cigarettes    Start date: 10/27/1969    Quit date: 10/28/1974    Years since quitting: 49.3   Smokeless tobacco: Never   Tobacco comments:    quit smoking 36 yrs. ago  Vaping Use   Vaping status: Never Used  Substance and Sexual Activity   Alcohol use: Yes    Comment: 3 glasses of wine daily   Drug use: No   Sexual activity: Not Currently    Birth control/protection: Surgical     Comment: Hyst; First IC >16 y/o, <5 Partners  Other Topics Concern   Not on file  Social History Narrative   Not on file   Social Drivers of Health   Financial Resource Strain: Not on file  Food Insecurity: Not on file  Transportation Needs: Not on file  Physical Activity: Not on file  Stress: Not on file  Social Connections: Not on file  Intimate Partner Violence: Not on file    FAMILY HISTORY: Family History  Problem Relation Age of Onset   Diabetes Mother    Heart disease Mother    Cancer Father        skin   Heart disease Father    Stroke Sister    Heart disease Brother    Cancer Brother        Colon,Liver,Prostate    ALLERGIES:  is allergic to codeine, adhesive [tape], gold-containing drug products, neomycin-bacitracin  zn-polymyx, nickel, and silver.  MEDICATIONS:  Current Outpatient Medications  Medication Sig Dispense Refill   Adalimumab (HUMIRA [place]) Inject into the skin.     albuterol  (PROVENTIL  HFA;VENTOLIN  HFA) 108 (90 BASE) MCG/ACT inhaler Inhale 2 puffs into the lungs every 6 (six) hours as needed for shortness of breath.     ALPRAZolam (XANAX) 0.25 MG tablet      aspirin  EC 81 MG tablet Take 1 tablet (81 mg total) by mouth daily. Swallow whole. 30 tablet 0   aspirin -acetaminophen -caffeine (EXCEDRIN MIGRA[place]) 250-250-65 MG tablet Take 1 tablet by mouth every 6 (six) hours as needed for headache.     buPROPion  (WELLBUTRIN  SR) 100 MG 12 hr tablet Take 100 mg by mouth daily.     cetirizine (ZYRTEC) 10 MG tablet Take 10 mg by mouth daily.     clobetasol  cream (TEMOVATE ) 0.05 % Apply 1 Application topically 2 (two) times a week. 60 g 2   estradiol  (ESTRACE  VAGINAL) 0.1 MG/GM vaginal cream Place 1 g vaginally 2 (two) times a week. 42.5 g 2   fluticasone  (FLONASE ) 50 MCG/ACT nasal spray Place 2 sprays into the nose daily as needed for rhinitis.     halobetasol (ULTRAVATE) 0.05 % cream Apply 1 application topically daily.      hydrocortisone  (ANUSOL -HC) 2.5 % rectal  cream Place 1 application rectally 2 (two) times daily. 30 g 0   hydroxyurea  (HYDREA ) 500 MG capsule Take 1 capsule (500 mg total) by mouth daily. May take with food to minimize GI side effects. 90 capsule 1   LINZESS 290 MCG CAPS capsule Take 290 mcg by mouth daily.     losartan -hydrochlorothiazide  (HYZAAR) 100-25 MG per tablet Take 1 tablet by mouth daily.     omeprazole (PRILOSEC) 20 MG capsule Take 20 mg  by mouth 2 (two) times daily.     sertraline (ZOLOFT) 100 MG tablet Take 150 mg by mouth daily.     SUMAtriptan  (IMITREX ) 100 MG tablet Take 100 mg by mouth every 2 (two) hours as needed for migraine. For headache     No current facility-administered medications for this visit.    REVIEW OF SYSTEMS:   Constitutional: ( - ) fevers, ( - )  chills , ( - ) night sweats Eyes: ( - ) blurriness of vision, ( - ) double vision, ( - ) watery eyes Ears, nose, mouth, throat, and face: ( - ) mucositis, ( - ) sore throat Respiratory: ( - ) cough, ( - ) dyspnea, ( - ) wheezes Cardiovascular: ( - ) palpitation, ( - ) chest discomfort, ( - ) lower extremity swelling Gastrointestinal:  ( - ) nausea, ( - ) heartburn, ( - ) change in bowel habits Skin: ( - ) abnormal skin rashes Lymphatics: ( - ) new lymphadenopathy, ( - ) easy bruising Neurological: ( - ) numbness, ( - ) tingling, ( - ) new weaknesses Behavioral/Psych: ( - ) mood change, ( - ) new changes  All other systems were reviewed with the patient and are negative.  PHYSICAL EXAMINATION:  There were no vitals filed for this visit.   There were no vitals filed for this visit.    GENERAL: Well-appearing elderly Caucasian female, alert, no distress and comfortable SKIN: skin color, texture, turgor are normal, no rashes or significant lesions EYES: conjunctiva are pink and non-injected, sclera clear LUNGS: clear to auscultation and percussion with normal breathing effort HEART: regular rate & rhythm and no murmurs and no lower extremity  edema Musculoskeletal: no cyanosis of digits and no clubbing  PSYCH: alert & oriented x 3, fluent speech NEURO: no focal motor/sensory deficits  LABORATORY DATA:  I have reviewed the data as listed    Latest Ref Rng & Units 12/17/2023    9:25 AM 09/03/2023    9:49 AM 08/04/2023   10:34 AM  CBC  WBC 4.0 - 10.5 K/uL 4.7  6.5  6.5   Hemoglobin 12.0 - 15.0 g/dL 87.3  87.6  87.3   Hematocrit 36.0 - 46.0 % 35.3  34.7  35.6   Platelets 150 - 400 K/uL 299  327  370        Latest Ref Rng & Units 12/17/2023    9:25 AM 09/03/2023    9:49 AM 08/04/2023   10:34 AM  CMP  Glucose 70 - 99 mg/dL 897  894  897   BUN 8 - 23 mg/dL 15  22  22    Creatinine 0.44 - 1.00 mg/dL 9.01  9.03  8.87   Sodium 135 - 145 mmol/L 131  134  138   Potassium 3.5 - 5.1 mmol/L 3.9  4.1  3.6   Chloride 98 - 111 mmol/L 95  97  102   CO2 22 - 32 mmol/L 31  32  29   Calcium 8.9 - 10.3 mg/dL 9.2  9.2  9.1   Total Protein 6.5 - 8.1 g/dL 6.9  6.9  6.9   Total Bilirubin 0.0 - 1.2 mg/dL 0.4  0.5  0.5   Alkaline Phos 38 - 126 U/L 64  52  56   AST 15 - 41 U/L 14  14  14    ALT 0 - 44 U/L 12  10  10      RADIOGRAPHIC STUDIES: No results found.   ASSESSMENT &  PLAN Kim Ferrell is a 78 y.o. female with medical history significant for essential thrombocytosis, JAK2 positive who presents for a follow up visit.   # Essential Thrombocytosis, JAK2 positive -- Diagnosis confirmed with bone marrow biopsy.  Patient found to have JAK2 mutation on NGS panel -- Patient currently taking aspirin  81 mg p.o. daily in addition to hydroxyurea  500 mg p.o. daily -- Patient is tolerating treatment well with no major side effects. -- Labs today show white blood cell count 7.1, Hgb 12.5, MCV 104.1, Plt 379, creatinine and LFTs are in range.  --Continue with hydroxyurea  treatment without any dose modifications -- RTC in 3 months for labs/follow up.   No orders of the defined types were placed in this encounter.   All questions were answered. The  patient knows to call the clinic with any problems, questions or concerns.   I have spent a total of 25 minutes minutes of face-to-face and non-face-to-face time, preparing to see the patient,  performing a medically appropriate examination, counseling and educating the patient, ordering medications/tests/procedures, documenting clinical information in the electronic health record, independently interpreting results and communicating results to the patient, and care coordination.   Norleen IVAR Kidney, MD Department of Hematology/Oncology [facility] at [facility] Phone: [phone] Pager: [phone] Email: [email or web address]    03/03/2024 7:31 AM

## 2024-03-05 DIAGNOSIS — M5412 Radiculopathy, cervical region: Secondary | ICD-10-CM | POA: Diagnosis not present

## 2024-03-05 DIAGNOSIS — Z6826 Body mass index (BMI) 26.0-26.9, adult: Secondary | ICD-10-CM | POA: Diagnosis not present

## 2024-03-07 ENCOUNTER — Other Ambulatory Visit: Payer: Self-pay

## 2024-03-07 ENCOUNTER — Emergency Department (HOSPITAL_BASED_OUTPATIENT_CLINIC_OR_DEPARTMENT_OTHER): Admitting: Radiology

## 2024-03-07 ENCOUNTER — Encounter (HOSPITAL_BASED_OUTPATIENT_CLINIC_OR_DEPARTMENT_OTHER): Payer: Self-pay

## 2024-03-07 ENCOUNTER — Emergency Department (HOSPITAL_BASED_OUTPATIENT_CLINIC_OR_DEPARTMENT_OTHER)
Admission: EM | Admit: 2024-03-07 | Discharge: 2024-03-07 | Disposition: A | Discharge status: Home / Self Care | Attending: Emergency Medicine | Admitting: Emergency Medicine

## 2024-03-07 DIAGNOSIS — Z79899 Other long term (current) drug therapy: Secondary | ICD-10-CM | POA: Insufficient documentation

## 2024-03-07 DIAGNOSIS — I10 Essential (primary) hypertension: Secondary | ICD-10-CM | POA: Diagnosis not present

## 2024-03-07 DIAGNOSIS — R079 Chest pain, unspecified: Secondary | ICD-10-CM | POA: Diagnosis not present

## 2024-03-07 DIAGNOSIS — R030 Elevated blood-pressure reading, without diagnosis of hypertension: Secondary | ICD-10-CM

## 2024-03-07 DIAGNOSIS — E871 Hypo-osmolality and hyponatremia: Secondary | ICD-10-CM | POA: Diagnosis not present

## 2024-03-07 DIAGNOSIS — R072 Precordial pain: Secondary | ICD-10-CM

## 2024-03-07 DIAGNOSIS — R6884 Jaw pain: Secondary | ICD-10-CM | POA: Insufficient documentation

## 2024-03-07 DIAGNOSIS — Z7982 Long term (current) use of aspirin: Secondary | ICD-10-CM | POA: Insufficient documentation

## 2024-03-07 DIAGNOSIS — R0789 Other chest pain: Secondary | ICD-10-CM | POA: Diagnosis not present

## 2024-03-07 LAB — TROPONIN T, HIGH SENSITIVITY
Troponin T High Sensitivity: <15 ng/L (ref 0–19)
Troponin T High Sensitivity: <15 ng/L (ref 0–19)

## 2024-03-07 LAB — CBC
HCT: 34.3 % — ABNORMAL LOW (ref 36.0–46.0)
Hemoglobin: 12.3 g/dL (ref 12.0–15.0)
MCH: 36.5 pg — ABNORMAL HIGH (ref 26.0–34.0)
MCHC: 35.9 g/dL (ref 30.0–36.0)
MCV: 101.8 fL — ABNORMAL HIGH (ref 80.0–100.0)
Platelets: 361 K/uL (ref 150–400)
RBC: 3.37 MIL/uL — ABNORMAL LOW (ref 3.87–5.11)
RDW: 14.1 % (ref 11.5–15.5)
WBC: 5.5 K/uL (ref 4.0–10.5)
nRBC: 0 % (ref 0.0–0.2)

## 2024-03-07 LAB — BASIC METABOLIC PANEL WITH GFR
Anion gap: 9 (ref 5–15)
BUN: 16 mg/dL (ref 8–23)
CO2: 29 mmol/L (ref 22–32)
Calcium: 9.4 mg/dL (ref 8.9–10.3)
Chloride: 92 mmol/L — ABNORMAL LOW (ref 98–111)
Creatinine, Ser: 1 mg/dL (ref 0.44–1.00)
GFR, Estimated: 58 mL/min — ABNORMAL LOW (ref >60)
Glucose, Bld: 109 mg/dL — ABNORMAL HIGH (ref 70–99)
Potassium: 3.5 mmol/L (ref 3.5–5.1)
Sodium: 130 mmol/L — ABNORMAL LOW (ref 135–145)

## 2024-03-07 NOTE — ED Provider Notes (Signed)
 [place] EMERGENCY DEPARTMENT AT [facility] Provider Note   CSN: [id number] Arrival date & time: 03/07/24  1027     Patient presents with: Chest Pain   Kim Ferrell is a 78 y.o. female.   Pt with c/o noticing a pain to jaw area while at choir/sitting, and then indicates felt in upper chest and felt lightheaded feeling. No syncope. No palpitations. No recent history similar symptoms. No recent other chest discomfort or any exertional chest discomfort. Denies hx cad. No associated nv, diaphoresis or sob. No unusual doe/fatigue.   The history is provided by the patient, medical records and the spouse.  Chest Pain Associated symptoms: no abdominal pain, no back pain, no cough, no fever, no headache, no numbness, no palpitations, no shortness of breath, no vomiting and no weakness        Prior to Admission medications   Medication Sig Start Date End Date Taking? Authorizing Provider  Adalimumab (HUMIRA [place]) Inject into the skin.    [provider]  albuterol  (PROVENTIL  HFA;VENTOLIN  HFA) 108 (90 BASE) MCG/ACT inhaler Inhale 2 puffs into the lungs every 6 (six) hours as needed for shortness of breath.    [provider]  ALPRAZolam (XANAX) 0.25 MG tablet  12/10/19   [provider]  aspirin  EC 81 MG tablet Take 1 tablet (81 mg total) by mouth daily. Swallow whole. 05/06/23   Burton, Lacie K, NP  aspirin -acetaminophen -caffeine (EXCEDRIN MIGRAINE) 250-250-65 MG tablet Take 1 tablet by mouth every 6 (six) hours as needed for headache.    [provider]  buPROPion  (WELLBUTRIN  SR) 100 MG 12 hr tablet Take 100 mg by mouth daily.    [provider]  cetirizine (ZYRTEC) 10 MG tablet Take 10 mg by mouth daily.    [provider]  clobetasol  cream (TEMOVATE ) 0.05 % Apply 1 Application topically 2 (two) times a week. 08/14/23   [name], NP  estradiol  (ESTRACE  VAGINAL) 0.1 MG/GM vaginal cream Place 1 g vaginally 2 (two) times a  week. 08/14/23   [name], NP  fluticasone  (FLONASE ) 50 MCG/ACT nasal spray Place 2 sprays into the nose daily as needed for rhinitis.    [provider]  halobetasol (ULTRAVATE) 0.05 % cream Apply 1 application topically daily.     [provider]  hydrocortisone  (ANUSOL -HC) 2.5 % rectal cream Place 1 application rectally 2 (two) times daily. 04/14/21   Towana Ozell BROCKS, MD  hydroxyurea  (HYDREA ) 500 MG capsule Take 1 capsule (500 mg total) by mouth daily. May take with food to minimize GI side effects. 12/17/23   Thayil, Irene T, PA-C  LINZESS 290 MCG CAPS capsule Take 290 mcg by mouth daily.    [provider]  losartan -hydrochlorothiazide  (HYZAAR) 100-25 MG per tablet Take 1 tablet by mouth daily.    [provider]  omeprazole (PRILOSEC) 20 MG capsule Take 20 mg by mouth 2 (two) times daily.    [provider]  sertraline (ZOLOFT) 100 MG tablet Take 150 mg by mouth daily. 07/24/21   [provider]  SUMAtriptan  (IMITREX ) 100 MG tablet Take 100 mg by mouth every 2 (two) hours as needed for migraine. For headache    [provider]    Allergies: Codeine, Adhesive [tape], Gold-containing drug products, Neomycin-bacitracin  zn-polymyx, Nickel, and Silver    Review of Systems  Constitutional:  Negative for chills and fever.  HENT:  Negative for sore throat.   Eyes:  Negative for visual disturbance.  Respiratory:  Negative for cough and shortness of breath.   Cardiovascular:  Positive for chest pain. Negative for palpitations and leg swelling.  Gastrointestinal:  Negative for abdominal pain and vomiting.  Genitourinary:  Negative for flank pain.  Musculoskeletal:  Negative for back pain.  Neurological:  Positive for light-headedness. Negative for syncope, weakness, numbness and headaches.    Updated Vital Signs BP (!) 148/85   Pulse 72   Temp 97.8 F (36.6 C)   Resp 13   LMP 04/02/1987   SpO2 95%   Physical  Exam Vitals and nursing note reviewed.  Constitutional:      Appearance: Normal appearance. She is well-developed.  HENT:     Head: Atraumatic.     Nose: Nose normal.     Mouth/Throat:     Mouth: Mucous membranes are moist.     Pharynx: Oropharynx is clear.  Eyes:     General: No scleral icterus.    Conjunctiva/sclera: Conjunctivae normal.     Pupils: Pupils are equal, round, and reactive to light.  Neck:     Vascular: No carotid bruit.     Trachea: No tracheal deviation.  Cardiovascular:     Rate and Rhythm: Normal rate and regular rhythm.     Pulses: Normal pulses.     Heart sounds: Normal heart sounds. No murmur heard.    No friction rub. No gallop.  Pulmonary:     Effort: Pulmonary effort is normal. No respiratory distress.     Breath sounds: Normal breath sounds.  Abdominal:     General: Bowel sounds are normal. There is no distension.     Palpations: Abdomen is soft.     Tenderness: There is no abdominal tenderness. There is no guarding.  Musculoskeletal:        General: No swelling or tenderness.     Cervical back: Normal range of motion and neck supple. No rigidity. No muscular tenderness.     Right lower leg: No edema.     Left lower leg: No edema.  Skin:    General: Skin is warm and dry.     Findings: No rash.  Neurological:     Mental Status: She is alert.     Comments: Alert, speech normal. Motor/sens grossly intact bil.   Psychiatric:        Mood and Affect: Mood normal.     (all labs ordered are listed, but only abnormal results are displayed) Results for orders placed or performed during the hospital encounter of 03/07/24  Basic metabolic panel   Collection Time: 03/07/24 10:44 AM  Result Value Ref Range   Sodium 130 (L) 135 - 145 mmol/L   Potassium 3.5 3.5 - 5.1 mmol/L   Chloride 92 (L) 98 - 111 mmol/L   CO2 29 22 - 32 mmol/L   Glucose, Bld 109 (H) 70 - 99 mg/dL   BUN 16 8 - 23 mg/dL   Creatinine, Ser 8.99 0.44 - 1.00 mg/dL   Calcium 9.4 8.9 -  89.6 mg/dL   GFR, Estimated 58 (L) >60 mL/min   Anion gap 9 5 - 15  CBC   Collection Time: 03/07/24 10:44 AM  Result Value Ref Range   WBC 5.5 4.0 - 10.5 K/uL   RBC 3.37 (L) 3.87 - 5.11 MIL/uL   Hemoglobin 12.3 12.0 - 15.0 g/dL   HCT 65.6 (L) 63.9 - 53.9 %   MCV 101.8 (H) 80.0 - 100.0 fL   MCH 36.5 (H) 26.0 - 34.0 pg  MCHC 35.9 30.0 - 36.0 g/dL   RDW 85.8 88.4 - 84.4 %   Platelets 361 150 - 400 K/uL   nRBC 0.0 0.0 - 0.2 %  Troponin T, High Sensitivity   Collection Time: 03/07/24 10:44 AM  Result Value Ref Range   Troponin T High Sensitivity <15 0 - 19 ng/L  Troponin T, High Sensitivity   Collection Time: 03/07/24 12:35 PM  Result Value Ref Range   Troponin T High Sensitivity <15 0 - 19 ng/L     EKG: EKG Interpretation Date/Time:  Sunday March 07 2024 10:47:45 EST Ventricular Rate:  71 PR Interval:  190 QRS Duration:  101 QT Interval:  419 QTC Calculation: 456 R Axis:   -3  Text Interpretation: Sinus rhythm Confirmed by Bernard Drivers ([id number]) on 03/07/2024 10:50:38 AM  Radiology: DG Chest 2 View Result Date: 03/07/2024 EXAM: 2 VIEW(S) XRAY OF THE CHEST 03/07/2024 11:12:03 AM COMPARISON: 01/15/2023 CLINICAL HISTORY: Chest pain FINDINGS: LUNGS AND PLEURA: No focal pulmonary opacity. No pleural effusion. No pneumothorax. HEART AND MEDIASTINUM: No acute abnormality of the cardiac and mediastinal silhouettes. BONES AND SOFT TISSUES: No acute osseous abnormality. IMPRESSION: 1. No acute cardiopulmonary abnormality. Electronically signed by: Lynwood Seip MD 03/07/2024 11:30 AM EST RP Workstation: [id number]     Procedures   Medications Ordered in the ED - No data to display                                  Medical Decision Making Problems Addressed: Elevated blood pressure reading: acute illness or injury Essential hypertension: chronic illness or injury that poses a threat to life or bodily functions Hyponatremia: acute illness or injury    Details:  Acute/chronic Precordial chest pain: acute illness or injury with systemic symptoms that poses a threat to life or bodily functions  Amount and/or Complexity of Data Reviewed Independent Historian: spouse External Data Reviewed: notes. Labs: ordered. Decision-making details documented in ED Course. Radiology: ordered and independent interpretation performed. Decision-making details documented in ED Course. ECG/medicine tests: ordered and independent interpretation performed. Decision-making details documented in ED Course.  Risk Decision regarding hospitalization.   Iv ns. Continuous pulse ox and cardiac monitoring. Labs ordered/sent. Imaging ordered.   Differential diagnosis includes acs, msk cp, gi cp, etc. Dispo decision including potential need for admission considered - will get labs and imaging and reassess.   Reviewed nursing notes and prior charts for additional history. External reports reviewed. Additional history from: spouse.   Cardiac monitor: sinus rhythm, rate 74.  Labs reviewed/interpreted by me - wbc and hgb normal. Na mildly low, similar to prior. Trop x 2 normal - felt not c/w acs. Recheck, no current pain, no neck pain, no chest pain, no sob.   Xrays reviewed/interpreted by me - no pna.   Po fluids/food, ambulate in hall. No faintness.   Pt currently appears stable for ed d/c.   Rec close pcp/card f/u.  Return precautions provided.       Final diagnoses:  Precordial chest pain  Hyponatremia  Elevated blood pressure reading  Essential hypertension    ED Discharge Orders          Ordered    Ambulatory referral to Cardiology       Comments: If you have not heard from the Cardiology office within the next 72 hours please call [phone].   03/07/24 1340  Bernard Drivers, MD 03/07/24 1344

## 2024-03-07 NOTE — ED Triage Notes (Signed)
 She reports sudden onset of upper chest and jaw pain while sitting and singing in a church choir this morning. Her skin is normal, warm and dry. Her husband is with her. She states she has never had this before.

## 2024-03-07 NOTE — ED Notes (Signed)
 Pt ambulated to the bathroom well without any assistance

## 2024-03-07 NOTE — Discharge Instructions (Addendum)
 It was our pleasure to provide your ER care today - we hope that you feel better.  For recent chest pain, follow up closely with cardiologist in the next 1-2 weeks - we made referral, and they should be contacting you with an appointment in the next few days.   From today's labs, your heart tests look good/normal. Your sodium level is mildly low, similar to prior - get adequate sodium in diet, and follow up with primary care doctor in the next 1-2 weeks.   Return to ER right away if worse, new symptoms, fevers, recurrent/persistent chest pain, increased trouble breathing, weak/fainting, or other concern.

## 2024-03-19 ENCOUNTER — Other Ambulatory Visit: Payer: Self-pay

## 2024-03-19 DIAGNOSIS — R1030 Lower abdominal pain, unspecified: Secondary | ICD-10-CM

## 2024-03-19 DIAGNOSIS — R935 Abnormal findings on diagnostic imaging of other abdominal regions, including retroperitoneum: Secondary | ICD-10-CM

## 2024-03-29 NOTE — Progress Notes (Signed)
" °  Cardiology Office Note:   Date:  03/29/2024  ID:  Kim Ferrell, DOB Nov 17, 1945, MRN [id number] PCP: [name], MD  [facility] Providers Cardiologist:  None { Chief Complaint: No chief complaint on file.     History of Present Illness:   Kim Ferrell is a 78 y.o. female with a PMH of HTN, HLD, depression, and hemorrhoids who presents as a new patient evaluation by Dr. Kevin Steinl for the evaluation of chest pain.  The patient presented to the ED on 03/07/2024 with symptoms of chest pain.  Troponins were negative x 2.  CXR showed no acute cardiopulmonary abnormalities.  ECG was negative for ischemic changes.   Past Medical History:  Diagnosis Date   Anemia    hx   Arthritis    hands, knees   Asthma    has not needed any med. in 2 yrs.   Balance problem    Chronic bronchitis (HCC)    Dental crowns present    Depression    GERD (gastroesophageal reflux disease)    Hypertension    under control, has been on med. x 3-4 yrs.   Lichen sclerosus    Migraines    Osteopenia 10/2017   T score -1.8 FRAX 12% / 2.3% excluding history of lower leg fracture falling downstairs.   Pneumonia    hx   Psoriasis    Seasonal allergies    Trimalleolar fracture of right ankle 09/16/2011     Studies Reviewed:    EKG: ***           Risk Assessment/Calculations:   {Does this patient have ATRIAL FIBRILLATION?:[id number]} No BP recorded.  {Refresh Note OR Click here to enter BP  :1}***        Physical Exam:     VS:  LMP 04/02/1987  ***    Wt Readings from Last 3 Encounters:  03/03/24 158 lb (71.7 kg)  12/17/23 160 lb (72.6 kg)  09/03/23 157 lb 12.8 oz (71.6 kg)     GEN: Well nourished, well developed, in no acute distress NECK: No JVD; No carotid bruits CARDIAC: ***RRR, no murmurs, rubs, gallops RESPIRATORY:  Clear to auscultation without rales, wheezing or rhonchi  ABDOMEN: Soft, non-tender, non-distended, normal bowel sounds EXTREMITIES:  Warm and well  perfused, no edema; No deformity, 2+ radial pulses PSYCH: Normal mood and affect   Assessment & Plan       {Are you ordering a CV Procedure (e.g. stress test, cath, DCCV, TEE, etc)?   Press F2        :[id number]}   This note was written with the assistance of a dictation microphone or AI dictation software. Please excuse any typos or grammatical errors.   Signed, Georganna Archer, MD 03/29/2024 9:13 PM    [facility]  "

## 2024-03-30 ENCOUNTER — Other Ambulatory Visit: Payer: Self-pay

## 2024-03-30 ENCOUNTER — Ambulatory Visit
Attending: Student in an Organized Health Care Education/Training Program | Admitting: Student in an Organized Health Care Education/Training Program

## 2024-03-30 ENCOUNTER — Encounter: Payer: Self-pay | Admitting: Student in an Organized Health Care Education/Training Program

## 2024-03-30 VITALS — BP 114/70 | HR 76 | Ht 65.0 in | Wt 160.4 lb

## 2024-03-30 DIAGNOSIS — R072 Precordial pain: Secondary | ICD-10-CM

## 2024-03-30 DIAGNOSIS — H5461 Unqualified visual loss, right eye, normal vision left eye: Secondary | ICD-10-CM

## 2024-03-30 DIAGNOSIS — I10 Essential (primary) hypertension: Secondary | ICD-10-CM | POA: Insufficient documentation

## 2024-03-30 MED ORDER — METOPROLOL TARTRATE 100 MG PO TABS: 100.0000 mg | ORAL_TABLET | Freq: Once | ORAL | 0 refills | Status: AC

## 2024-03-30 NOTE — Patient Instructions (Addendum)
 " Testing/Procedures: Echocardiogram   Your physician has requested that you have an echocardiogram. Echocardiography is a painless test that uses sound waves to create images of your heart. It provides your doctor with information about the size and shape of your heart and how well your hearts chambers and valves are working. This procedure takes approximately one hour. There are no restrictions for this procedure. Please do NOT wear cologne, perfume, aftershave, or lotions (deodorant is allowed). Please arrive 15 minutes prior to your appointment time.  Please note: We ask at that you not bring children with you during ultrasound (echo/ vascular) testing. Due to room size and safety concerns, children are not allowed in the ultrasound rooms during exams. Our front office staff cannot provide observation of children in our lobby area while testing is being conducted. An adult accompanying a patient to their appointment will only be allowed in the ultrasound room at the discretion of the ultrasound technician under special circumstances. We apologize for any inconvenience.  *-*-*-*-*-*-*-*-*-*-*-*-*-*-*-*-*-*-*-*    Your cardiac CT will be scheduled at the below location:    [facility] [address]  [place], [place] [address]  At the Heart [facility] at [address], please enter the parking lot using the [address] entrance and use the FREE valet service at the patient drop-off area. Enter the building and check-in with registration on the main floor.   Please follow these instructions carefully (unless otherwise directed):  An IV will be required for this test and Nitroglycerin will be given.   On the Night Before the Test: Be sure to Drink plenty of water. Do not consume any caffeinated/decaffeinated beverages or chocolate 12 hours prior to your test. Do not take any antihistamines 12 hours prior to your test.   On the Day of the  Test: Drink plenty of water until 1 hour prior to the test. Do not eat any food 1 hour prior to test. You may take your regular medications prior to the test.  Take metoprolol (Lopressor) 100 mg two hours prior to test. If you take Furosemide/Hydrochlorothiazide /Spironolactone/Chlorthalidone, please HOLD on the morning of the test. (Hold losartan -hydrochlorothiazide  (Hyzaar) the morning of your test.) Patients who wear a continuous glucose monitor MUST remove the device prior to scanning. FEMALES- please wear underwire-free bra if available, avoid dresses & tight clothing      After the Test: Drink plenty of water. After receiving IV contrast, you may experience a mild flushed feeling. This is normal. On occasion, you may experience a mild rash up to 24 hours after the test. This is not dangerous. If this occurs, you can take Benadryl 25 mg, Zyrtec, Claritin, or Allegra  and increase your fluid intake. (Patients taking Tikosyn should avoid Benadryl, and may take Zyrtec, Claritin, or Allegra ) If you experience trouble breathing, this can be serious. If it is severe call 911 IMMEDIATELY. If it is mild, please call our office.  We will call to schedule your test 2-4 weeks out understanding that some insurance companies will need an authorization prior to the service being performed.   For more information and frequently asked questions, please visit our website : [email or web address]  For non-scheduling related questions, please contact the cardiac imaging nurse navigator should you have any questions/concerns: Cardiac Imaging Nurse Navigators Direct Office Dial: [phone]   For scheduling needs, including cancellations and rescheduling, please call Brittany, [phone].   Follow-Up: At [facility], you and your health needs are our priority.  As part of our continuing mission to provide you with exceptional heart care, our providers are all part of one team.  This  team includes your primary Cardiologist (physician) and Advanced Practice Providers or APPs (Physician Assistants and Nurse Practitioners) who all work together to provide you with the care you need, when you need it.  Your next appointment:   As needed pending results of tests    Provider:   Georganna Archer, MD            "

## 2024-03-30 NOTE — Assessment & Plan Note (Signed)
-   Blood pressure is at goal.  No changes. Continue losartan /hydrochlorothiazide  100-25

## 2024-04-02 ENCOUNTER — Ambulatory Visit
Admission: RE | Admit: 2024-04-02 | Discharge: 2024-04-02 | Disposition: A | Discharge status: Home / Self Care | Source: Ambulatory Visit

## 2024-04-02 DIAGNOSIS — R935 Abnormal findings on diagnostic imaging of other abdominal regions, including retroperitoneum: Secondary | ICD-10-CM

## 2024-04-02 DIAGNOSIS — R1030 Lower abdominal pain, unspecified: Secondary | ICD-10-CM

## 2024-04-02 MED ORDER — IOPAMIDOL (ISOVUE-300) INJECTION 61%
80.0000 mL | Freq: Once | INTRAVENOUS | Status: AC | PRN
  Administered 2024-04-02: 80 mL via INTRAVENOUS

## 2024-04-12 ENCOUNTER — Encounter (HOSPITAL_COMMUNITY): Payer: Self-pay

## 2024-04-13 ENCOUNTER — Ambulatory Visit (HOSPITAL_COMMUNITY)
Admission: RE | Admit: 2024-04-13 | Discharge: 2024-04-13 | Disposition: A | Discharge status: Home / Self Care | Source: Ambulatory Visit | Attending: Student in an Organized Health Care Education/Training Program | Admitting: Student in an Organized Health Care Education/Training Program

## 2024-04-13 ENCOUNTER — Ambulatory Visit: Payer: Self-pay | Admitting: Student in an Organized Health Care Education/Training Program

## 2024-04-13 DIAGNOSIS — R072 Precordial pain: Secondary | ICD-10-CM | POA: Insufficient documentation

## 2024-04-13 MED ORDER — IOHEXOL 350 MG/ML SOLN
100.0000 mL | Freq: Once | INTRAVENOUS | Status: AC | PRN
  Administered 2024-04-13: 100 mL via INTRAVENOUS

## 2024-04-13 MED ORDER — NITROGLYCERIN 0.4 MG SL SUBL
0.8000 mg | SUBLINGUAL_TABLET | Freq: Once | SUBLINGUAL | Status: AC
  Administered 2024-04-13: 0.8 mg via SUBLINGUAL

## 2024-05-03 ENCOUNTER — Ambulatory Visit (HOSPITAL_COMMUNITY)
Admission: RE | Admit: 2024-05-03 | Discharge: 2024-05-03 | Disposition: A | Discharge status: Home / Self Care | Source: Ambulatory Visit | Attending: Student in an Organized Health Care Education/Training Program | Admitting: Student in an Organized Health Care Education/Training Program

## 2024-05-03 DIAGNOSIS — R072 Precordial pain: Secondary | ICD-10-CM | POA: Diagnosis not present

## 2024-05-03 LAB — ECHOCARDIOGRAM COMPLETE
Area-P 1/2: 2.48 cm2
S' Lateral: 2.6 cm

## 2024-06-02 ENCOUNTER — Inpatient Hospital Stay: Admitting: Hematology and Oncology

## 2024-06-02 ENCOUNTER — Inpatient Hospital Stay: Attending: Hematology and Oncology
